# Patient Record
Sex: Male | Born: 1995 | Race: White | Hispanic: No
Health system: Southern US, Community
[De-identification: ages and names within clinical notes are randomized; demographics above are authoritative.]

## PROBLEM LIST (undated history)

## (undated) DIAGNOSIS — C801 Malignant (primary) neoplasm, unspecified: Secondary | ICD-10-CM

## (undated) HISTORY — DX: Malignant (primary) neoplasm, unspecified: C80.1

---
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
## 2015-02-01 ENCOUNTER — Encounter: Payer: Self-pay | Admitting: Family Medicine

## 2015-02-01 DIAGNOSIS — C9101 Acute lymphoblastic leukemia, in remission: Secondary | ICD-10-CM

## 2016-05-06 ENCOUNTER — Encounter: Payer: Self-pay | Admitting: Physician Assistant

## 2016-05-06 DIAGNOSIS — C419 Malignant neoplasm of bone and articular cartilage, unspecified: Secondary | ICD-10-CM | POA: Insufficient documentation

## 2016-06-25 ENCOUNTER — Ambulatory Visit: Payer: BC Managed Care – PPO | Attending: Orthopedic Surgery | Admitting: Physical Therapy

## 2016-06-25 ENCOUNTER — Encounter: Payer: Self-pay | Admitting: Physical Therapy

## 2016-06-25 DIAGNOSIS — M6281 Muscle weakness (generalized): Secondary | ICD-10-CM | POA: Diagnosis present

## 2016-06-25 DIAGNOSIS — M25521 Pain in right elbow: Secondary | ICD-10-CM | POA: Insufficient documentation

## 2016-06-25 DIAGNOSIS — M25611 Stiffness of right shoulder, not elsewhere classified: Secondary | ICD-10-CM | POA: Diagnosis present

## 2016-06-25 DIAGNOSIS — M25621 Stiffness of right elbow, not elsewhere classified: Secondary | ICD-10-CM | POA: Diagnosis present

## 2016-06-25 DIAGNOSIS — M25511 Pain in right shoulder: Secondary | ICD-10-CM

## 2016-06-25 NOTE — Therapy (Signed)
Maurice Wright, [place], [address] Phone: [phone]   Fax:  [phone]  Physical Therapy Evaluation  Patient Details  Name: Maurice Wright MRN: [id number] Date of Birth: December 01, 1995 Referring Provider: Chong Sicilian, MD  Encounter Date: 06/25/2016      PT End of Session - 06/25/16 1257    Visit Number 1   Number of Visits 30   Date for PT Re-Evaluation 09/19/16   Authorization Type BCBS- no visit limit   PT Start Time 1148   PT Stop Time 1231   PT Time Calculation (min) 43 min   Activity Tolerance Patient tolerated treatment well   Behavior During Therapy [facility] for tasks assessed/performed      Past Medical History:  Diagnosis Date  . Cancer [facility])     History reviewed. No pertinent surgical history.  There were no vitals filed for this visit.       Subjective Assessment - 06/25/16 1155    Subjective Pt reports pain is positionally dependent; pain free with arm rested. Feels tense. Yesterday was the first day he was out of his sling (had pillow). Elbow feels more stiff.    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 No/denies   Pain Score --  4/10 last 24 hrs   Pain Location Arm  R shoulder-upper arm-elbow   Pain Orientation Right;Upper   Pain Type Surgical pain   Pain Frequency Intermittent   Aggravating Factors  positionally dependent, stretching elbow   Pain Relieving Factors changing position            [facility] PT Assessment - 06/25/16 0001      Assessment   Medical Diagnosis R humerus replacement   Referring Provider Chong Sicilian, MD   Onset Date/Surgical Date 05/09/16  revision of ulnar aspect on 05/10/16   Hand Dominance Right     Precautions   Precautions Other (comment)   Precaution Comments Cancer-active     Restrictions   Weight Bearing  Restrictions Yes   RUE Weight Bearing Weight bearing as tolerated  weight bearing as appropraite     Balance Screen   Has the patient fallen in the past 6 months No     [place] residence   Living Arrangements Parent     Prior Function   Level of Independence Independent     Cognition   Overall Cognitive Status Within Functional Limits for tasks assessed     Observation/Other Assessments   Focus on Therapeutic Outcomes (FOTO)  28% ability (goal 71%)     Sensation   Additional Comments WFL     ROM / Strength   AROM / PROM / Strength AROM;PROM;Strength     AROM   AROM Assessment Site Shoulder;Elbow   Right/Left Shoulder Right   Right Shoulder Extension 24 Degrees   Right Shoulder Flexion 0 Degrees   Right Shoulder ABduction 0 Degrees   Right Shoulder Internal Rotation 0 Degrees   Right Shoulder External Rotation 0 Degrees   Right/Left Elbow Right   Right Elbow Flexion 0   Right Elbow Extension -37     PROM   PROM Assessment Site Shoulder;Elbow   Right/Left Shoulder Right   Right Shoulder Flexion 55 Degrees   Right Shoulder ABduction 40 Degrees   Right Shoulder Internal Rotation --  0 abduction, to stomach  Right Shoulder External Rotation 0 Degrees  0 deg abduction   Right/Left Elbow Right   Right Elbow Flexion 85   Right Elbow Extension -35     Strength   Overall Strength Comments shoulder and elbow grossly 1/5 at eval   Strength Assessment Site Shoulder;Elbow;Hand   Right/Left Shoulder Right   Right/Left Elbow Right   Right/Left hand Right   Right Hand Grip (lbs) 25 25 25   L 95 90 90     Palpation   Palpation comment TTP at GHJ anterior aspect, limited scar mobility                   [facility] Adult PT Treatment/Exercise - 06/25/16 0001      Exercises   Exercises Shoulder;Elbow     Elbow Exercises   Other elbow exercises self extension stretch with supination stretch     Shoulder Exercises: Supine    External Rotation Limitations elbow rested, lifting hand from stomach     Shoulder Exercises: Seated   Other Seated Exercises scapular retraction for posture     Manual Therapy   Manual Therapy Soft tissue mobilization   Manual therapy comments edu in self mobilization of scar at home   Soft tissue mobilization scar mobilization                PT Education - 06/25/16 1256    Education provided Yes   Education Details anatomy of condition, POC, HEP, exercise form/rationale   Person(s) Educated Patient;Parent(s)   Methods Explanation;Demonstration;Tactile cues;Verbal cues   Comprehension Verbalized understanding;Returned demonstration;Verbal cues required;Tactile cues required;Need further instruction          PT Short Term Goals - 06/25/16 1312      PT SHORT TERM GOAL #1   Title GHJ PROM within 20 deg of L shoulder by 4/20   Baseline L GHJ WFL, see flowsheet   Time 4   Period Weeks   Status New     PT SHORT TERM GOAL #2   Title Pt will demo active elbow flexion to 90 deg against gravity, 2+/5 periscapular MMT by 4/20   Baseline see flowsheet   Time 4   Period Weeks   Status New     PT SHORT TERM GOAL #3   Title Pt able to perform GHJ ROM to shoulder height actively, against gravity by 5/4   Baseline 1/5 gross strength at eval   Time 6   Period Weeks   Status New     PT SHORT TERM GOAL #4   Title Full elbow extension actively by 5/4   Baseline see flowsheet   Time 6   Period Weeks   Status New     PT SHORT TERM GOAL #5   Title Pt will verbalize increased use of RUE for self care activities by 4/20   Baseline not using R arm at eval   Time 4   Period Weeks   Status New           PT Long Term Goals - 06/25/16 1317      PT LONG TERM GOAL #1   Title Pt will be able to use RUE to draw at and below shoulder height without hiking shoulder by 6/15 in order to begin return to school   Baseline unable at eval   Time 12   Period Weeks   Status New      PT LONG TERM GOAL #2   Title Pt will demo GHJ AROM against  gravity within 15 deg of LUE   Baseline see flowsheet   Time 12   Period Weeks   Status New     PT LONG TERM GOAL #3   Title Pt will be able to carry & manipulate a 3lb weight in his R hand to improve functional carrying & lifting capacity   Baseline unable at eval   Time 12   Period Weeks   Status New     PT LONG TERM GOAL #4   Title FOTO to 71% ability to indicate significant improvement in functional ability   Baseline 28% ability at eval   Time 12   Period Weeks   Status New               Plan - 06/25/16 1258    Clinical Impression Statement Pt presents to PT with complaints of R arm pain, tightness and limited functional use. Pt is significantly lacking ROM in shoulder and elbow, limited by joint and incision scar that extends from shoulder to below the elbow. MMT in shoulder and elbow grossly 1/5 with poor grip strength. 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 Pt will benefit from skilled PT in order to improve functional use of arm to return to age appropriate activities and meet long term functional goals. Pt will be in and out for periods of time for chemo therapy, Dad reports he will be receiving inpatient rehab during 5 day stays at the hospital. I asked pt to sign a release form for open communication between outpatient and inpatient rehab.    Rehab Potential Good   PT Frequency 3x / week  except for weeks he is getting inpatient rehab in the hospital during chemo   PT Duration 12 weeks   PT Treatment/Interventions ADLs/Self Care Home Management;Cryotherapy;Functional mobility training;Therapeutic activities;Therapeutic exercise;Neuromuscular re-education;Patient/family education;Passive range of motion;Scar mobilization;Manual lymph drainage;Manual techniques;Taping   PT Next Visit Plan scar mobility, RC activation, elbow extension stretch,  periscapular activation   PT Home Exercise Plan scar mobility, playdough hand work, scapular retraction, passive supination stretch-pull hand fwd to stretch elbow-active GHJ ext to pull back;    Consulted and Agree with Plan of Care Patient;Family member/caregiver   Family Member Consulted Dad      Patient will benefit from skilled therapeutic intervention in order to improve the following deficits and impairments:  Decreased range of motion, Increased fascial restricitons, Impaired UE functional use, Increased muscle spasms, Decreased endurance, Decreased activity tolerance, Pain, Improper body mechanics, Impaired flexibility, Hypomobility, Decreased scar mobility, Decreased mobility, Decreased strength, Postural dysfunction  Visit Diagnosis: Right shoulder pain, unspecified chronicity - Plan: PT plan of care cert/re-cert  Stiffness of right shoulder, not elsewhere classified - Plan: PT plan of care cert/re-cert  Pain in right elbow - Plan: PT plan of care cert/re-cert  Stiffness of right elbow, not elsewhere classified - Plan: PT plan of care cert/re-cert  Muscle weakness (generalized) - Plan: PT plan of care cert/re-cert     Problem List Patient Active Problem List   Diagnosis Date Noted  . 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 Leukemia, lymphocytic, acute ([place]) 12/21/2012    Maurice Wright PT, DPT 06/25/16 1:30 PM   [facility] [address] [place], [place], [address] Phone: [phone]   Fax:  [phone]  Name: Maurice Wright MRN: [id number] Date of Birth: 06/16/1995

## 2016-06-27 ENCOUNTER — Encounter: Payer: Self-pay | Admitting: Physical Therapy

## 2016-06-27 ENCOUNTER — Ambulatory Visit: Payer: BC Managed Care – PPO | Admitting: Physical Therapy

## 2016-06-27 DIAGNOSIS — M25511 Pain in right shoulder: Secondary | ICD-10-CM | POA: Diagnosis not present

## 2016-06-27 DIAGNOSIS — M25621 Stiffness of right elbow, not elsewhere classified: Secondary | ICD-10-CM

## 2016-06-27 DIAGNOSIS — M6281 Muscle weakness (generalized): Secondary | ICD-10-CM

## 2016-06-27 DIAGNOSIS — M25611 Stiffness of right shoulder, not elsewhere classified: Secondary | ICD-10-CM

## 2016-06-27 DIAGNOSIS — M25521 Pain in right elbow: Secondary | ICD-10-CM

## 2016-06-27 NOTE — Therapy (Signed)
[place], [place], [address] Phone: [phone]   Fax:  [phone]  Physical Therapy Treatment  Patient Details  Name: Maurice Wright MRN: [id number] Date of Birth: October 11, 1995 Referring Provider: Chong Sicilian, MD  Encounter Date: 06/27/2016      PT End of Session - 06/27/16 1221    Visit Number 2   Number of Visits 30   Date for PT Re-Evaluation 09/19/16   Authorization Type BCBS- no visit limit   PT Start Time 0806   PT Stop Time 0846   PT Time Calculation (min) 40 min   Activity Tolerance Patient tolerated treatment well   Behavior During Therapy [facility] for tasks assessed/performed      Past Medical History:  Diagnosis Date  . Cancer ([place])     No past surgical history on file.  There were no vitals filed for this visit.      Subjective Assessment - 06/27/16 0807    Subjective "things are going fine, some pain in the shoulder"    Currently in Pain? Yes   Pain Score 2    Pain Location Arm   Pain Orientation Right;Upper   Pain Descriptors / Indicators Aching;Sore   Pain Type Surgical pain                         [facility] Adult PT Treatment/Exercise - 06/27/16 0001      Elbow Exercises   Other elbow exercises 2 x 10 bicep curl with L hand assist PRN,   increased focus on eccentric lowering     Shoulder Exercises: Supine   Other Supine Exercises scapular retraction 2 x 10 with 3 sec hold     Shoulder Exercises: Isometric Strengthening   Flexion --  1 x 10 with 5 sec hold   Extension --  1 x 10 with 5 sec hold   External Rotation --  1 x 10 with 5 sec hold   Internal Rotation --  1 x 10 with 5 sec hold     Manual Therapy   Manual Therapy Passive ROM;Joint mobilization   Soft tissue mobilization scar mobilization   Passive ROM elbow extension with gentle distraction ossciliations, GHJ with flexion/ abduction                   PT Short Term Goals  - 06/25/16 1312      PT SHORT TERM GOAL #1   Title GHJ PROM within 20 deg of L shoulder by 4/20   Baseline L GHJ WFL, see flowsheet   Time 4   Period Weeks   Status New     PT SHORT TERM GOAL #2   Title Pt will demo active elbow flexion to 90 deg against gravity, 2+/5 periscapular MMT by 4/20   Baseline see flowsheet   Time 4   Period Weeks   Status New     PT SHORT TERM GOAL #3   Title Pt able to perform GHJ ROM to shoulder height actively, against gravity by 5/4   Baseline 1/5 gross strength at eval   Time 6   Period Weeks   Status New     PT SHORT TERM GOAL #4   Title Full elbow extension actively by 5/4   Baseline see flowsheet   Time 6   Period Weeks   Status New     PT SHORT TERM GOAL #5   Title Pt will verbalize increased  use of RUE for self care activities by 4/20   Baseline not using R arm at eval   Time 4   Period Weeks   Status New           PT Long Term Goals - 06/25/16 1317      PT LONG TERM GOAL #1   Title Pt will be able to use RUE to draw at and below shoulder height without hiking shoulder by 6/15 in order to begin return to school   Baseline unable at eval   Time 12   Period Weeks   Status New     PT LONG TERM GOAL #2   Title Pt will demo GHJ AROM against gravity within 15 deg of LUE   Baseline see flowsheet   Time 12   Period Weeks   Status New     PT LONG TERM GOAL #3   Title Pt will be able to carry & manipulate a 3lb weight in his R hand to improve functional carrying & lifting capacity   Baseline unable at eval   Time 12   Period Weeks   Status New     PT LONG TERM GOAL #4   Title FOTO to 71% ability to indicate significant improvement in functional ability   Baseline 28% ability at eval   Time 12   Period Weeks   Status New               Plan - 06/27/16 1221    Clinical Impression Statement pt continues to demo singificant tightness in the elbow limiting flexion/ extension. Reports consistency with HEP. Focused  on manual increasing elbow mobility, and PROM for shoulder with flexion/ abuction. worked on eccentric bicep strengthe,ing and isometric strengthenig for the shoulder to promote muscle activation.  He reported no changes in pain, and declined modalities.    PT Next Visit Plan scar mobility, RC activation, elbow extension stretch, periscapular activation   PT Home Exercise Plan scar mobility, playdough hand work, scapular retraction, passive supination stretch-pull hand fwd to stretch elbow-active GHJ ext to pull back;    Consulted and Agree with Plan of Care Patient;Family member/caregiver      Patient will benefit from skilled therapeutic intervention in order to improve the following deficits and impairments:  Decreased range of motion, Increased fascial restricitons, Impaired UE functional use, Increased muscle spasms, Decreased endurance, Decreased activity tolerance, Pain, Improper body mechanics, Impaired flexibility, Hypomobility, Decreased scar mobility, Decreased mobility, Decreased strength, Postural dysfunction  Visit Diagnosis: Right shoulder pain, unspecified chronicity  Stiffness of right shoulder, not elsewhere classified  Pain in right elbow  Stiffness of right elbow, not elsewhere classified  Muscle weakness (generalized)     Problem List Patient Active Problem List   Diagnosis Date Noted  . 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 Leukemia, lymphocytic, acute ([place]) 12/21/2012   Starr Lake PT, DPT, LAT, ATC  06/27/16  12:28 PM      [facility] [address] [place], [place], [address] Phone: [phone]   Fax:  [phone]  Name: Jhordan Mckibben MRN: [id number] Date of Birth: 1996-01-28

## 2016-07-07 ENCOUNTER — Ambulatory Visit: Payer: BC Managed Care – PPO | Attending: Orthopedic Surgery | Admitting: Physical Therapy

## 2016-07-07 ENCOUNTER — Encounter: Payer: Self-pay | Admitting: Physical Therapy

## 2016-07-07 DIAGNOSIS — M25621 Stiffness of right elbow, not elsewhere classified: Secondary | ICD-10-CM | POA: Diagnosis present

## 2016-07-07 DIAGNOSIS — M25611 Stiffness of right shoulder, not elsewhere classified: Secondary | ICD-10-CM | POA: Insufficient documentation

## 2016-07-07 DIAGNOSIS — M6281 Muscle weakness (generalized): Secondary | ICD-10-CM

## 2016-07-07 DIAGNOSIS — M25511 Pain in right shoulder: Secondary | ICD-10-CM | POA: Insufficient documentation

## 2016-07-07 DIAGNOSIS — M25521 Pain in right elbow: Secondary | ICD-10-CM | POA: Diagnosis present

## 2016-07-07 NOTE — Therapy (Signed)
[place], [place], [address] Phone: [phone]   Fax:  [phone]  Physical Therapy Treatment  Patient Details  Name: Maurice Wright MRN: [id number] Date of Birth: [birth year] Referring Provider: Chong Sicilian, MD  Encounter Date: 07/07/2016      PT End of Session - 07/07/16 1330    Visit Number 3   Number of Visits 30   Date for PT Re-Evaluation 09/19/16   Authorization Type BCBS- no visit limit   PT Start Time 1331   PT Stop Time 1413   PT Time Calculation (min) 42 min   Activity Tolerance Patient tolerated treatment well   Behavior During Therapy [facility] for tasks assessed/performed      Past Medical History:  Diagnosis Date  . Cancer [facility])     History reviewed. No pertinent surgical history.  There were no vitals filed for this visit.      Subjective Assessment - 07/07/16 1331    Subjective Reports PT in hospital doing isometrics and soft tissue work. Denies pain today. Does not feel the "dropping" feeling in his shoulder.    Currently in Pain? No/denies                         [facility] Adult PT Treatment/Exercise - 07/07/16 0001      Elbow Exercises   Elbow Flexion Limitations supine elbow flexion & ext with wand, supinated & pronated     Shoulder Exercises: Seated   Other Seated Exercises hand crawl to scapular retraction   Other Seated Exercises pronation/supination velcro board      Shoulder Exercises: Standing   Other Standing Exercises marble pick up, supinate to drop into vase     Shoulder Exercises: Isometric Strengthening   Flexion Limitations supine resisted by PT, assisted hold for elbow flexion   Extension Limitations supine into towel   External Rotation Limitations supine resisted by PT   Internal Rotation --  supine resisted by PT     Manual Therapy   Soft tissue mobilization scar mobilization   Passive ROM elbow extension                 PT Education - 07/07/16 1422    Education provided Yes   Education Details manual & exercise form/rationale   Person(s) Educated Patient   Methods Explanation;Demonstration;Tactile cues;Verbal cues   Comprehension Verbalized understanding;Returned demonstration;Verbal cues required;Tactile cues required;Need further instruction          PT Short Term Goals - 06/25/16 1312      PT SHORT TERM GOAL #1   Title GHJ PROM within 20 deg of L shoulder by 4/20   Baseline L GHJ WFL, see flowsheet   Time 4   Period Weeks   Status New     PT SHORT TERM GOAL #2   Title Pt will demo active elbow flexion to 90 deg against gravity, 2+/5 periscapular MMT by 4/20   Baseline see flowsheet   Time 4   Period Weeks   Status New     PT SHORT TERM GOAL #3   Title Pt able to perform GHJ ROM to shoulder height actively, against gravity by 5/4   Baseline 1/5 gross strength at eval   Time 6   Period Weeks   Status New     PT SHORT TERM GOAL #4   Title Full elbow extension actively by 5/4   Baseline see flowsheet   Time 6  Period Weeks   Status New     PT SHORT TERM GOAL #5   Title Pt will verbalize increased use of RUE for self care activities by 4/20   Baseline not using R arm at eval   Time 4   Period Weeks   Status New           PT Long Term Goals - 06/25/16 1317      PT LONG TERM GOAL #1   Title Pt will be able to use RUE to draw at and below shoulder height without hiking shoulder by 6/15 in order to begin return to school   Baseline unable at eval   Time 12   Period Weeks   Status New     PT LONG TERM GOAL #2   Title Pt will demo GHJ AROM against gravity within 15 deg of LUE   Baseline see flowsheet   Time 12   Period Weeks   Status New     PT LONG TERM GOAL #3   Title Pt will be able to carry & manipulate a 3lb weight in his R hand to improve functional carrying & lifting capacity   Baseline unable at eval   Time 12   Period Weeks   Status New     PT LONG  TERM GOAL #4   Title FOTO to 71% ability to indicate significant improvement in functional ability   Baseline 28% ability at eval   Time 12   Period Weeks   Status New               Plan - 07/07/16 1414    Clinical Impression Statement Returned to PT from inpatient rehab/chemo today. Increased challenges to wrist supinators and prontators today in conjunction with elbow flexors. Minimal activation noted around GHJ continues to limit ablity to perform higher level exercises.    PT Next Visit Plan scar mobility, RC activation, elbow extension stretch, periscapular activation   PT Home Exercise Plan scar mobility, playdough hand work, scapular retraction, passive supination stretch-pull hand fwd to stretch elbow-active GHJ ext to pull back; table slide (move GHJ)   Consulted and Agree with Plan of Care Patient      Patient will benefit from skilled therapeutic intervention in order to improve the following deficits and impairments:     Visit Diagnosis: Right shoulder pain, unspecified chronicity  Stiffness of right shoulder, not elsewhere classified  Pain in right elbow  Stiffness of right elbow, not elsewhere classified  Muscle weakness (generalized)     Problem List Patient Active Problem List   Diagnosis Date Noted  . 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 Leukemia, lymphocytic, acute ([place]) 12/21/2012   Alessandra Sawdey C. Cliffie Gingras PT, DPT 07/07/16 2:25 PM   [facility] [address] [place], [place], [address] Phone: [phone]   Fax:  [phone]  Name: Maurice Wright MRN: [id number] Date of Birth: 1996-04-04

## 2016-07-09 ENCOUNTER — Ambulatory Visit: Payer: BC Managed Care – PPO | Admitting: Physical Therapy

## 2016-07-09 DIAGNOSIS — M25521 Pain in right elbow: Secondary | ICD-10-CM

## 2016-07-09 DIAGNOSIS — M25611 Stiffness of right shoulder, not elsewhere classified: Secondary | ICD-10-CM

## 2016-07-09 DIAGNOSIS — M25511 Pain in right shoulder: Secondary | ICD-10-CM | POA: Diagnosis not present

## 2016-07-09 DIAGNOSIS — M25621 Stiffness of right elbow, not elsewhere classified: Secondary | ICD-10-CM

## 2016-07-09 DIAGNOSIS — M6281 Muscle weakness (generalized): Secondary | ICD-10-CM

## 2016-07-09 NOTE — Therapy (Signed)
[name], [place], [address] Phone: [phone]   Fax:  [phone]  Physical Therapy Treatment  Patient Details  Name: Maurice Wright MRN: [id number] Date of Birth: [birth year] Referring Provider: Chong Sicilian, MD  Encounter Date: 07/09/2016      PT End of Session - 07/09/16 1420    Visit Number 4   Number of Visits 30   Date for PT Re-Evaluation 09/19/16   Authorization Type BCBS- no visit limit   PT Start Time 0100   PT Stop Time 0150   PT Time Calculation (min) 50 min      Past Medical History:  Diagnosis Date  . Cancer ([place])     No past surgical history on file.  There were no vitals filed for this visit.      Subjective Assessment - 07/09/16 1302    Subjective Sore after last visit at the outside elbow   Currently in Pain? No/denies   Aggravating Factors  nothing   Pain Relieving Factors nothing                          [facility] Adult PT Treatment/Exercise - 07/09/16 0001      Elbow Exercises   Elbow Flexion Limitations supine elbow flexion and extension with longs hold in extension concurrent with scar tissue massage      Shoulder Exercises: Supine   External Rotation Limitations elbow rested, lifting hand from stomach   Internal Rotation Limitations rhythmic stab at neutral     Shoulder Exercises: Seated   Other Seated Exercises hand crawl to scapular retraction   Other Seated Exercises pronation/supination velcro board , wrist flexion and extension , yellow puddy x 1 minute grip      Shoulder Exercises: Isometric Strengthening   Flexion Limitations supine resisted by PTA, assisted hold for elbow flexion   Extension Limitations supine into towel   External Rotation Limitations supine resisted by PTA   Internal Rotation --  supine resisted by PTA     Manual Therapy   Soft tissue mobilization scar mobilization   Passive ROM elbow extension, shoulder  flexion                  PT Short Term Goals - 06/25/16 1312      PT SHORT TERM GOAL #1   Title GHJ PROM within 20 deg of L shoulder by 4/20   Baseline L GHJ WFL, see flowsheet   Time 4   Period Weeks   Status New     PT SHORT TERM GOAL #2   Title Pt will demo active elbow flexion to 90 deg against gravity, 2+/5 periscapular MMT by 4/20   Baseline see flowsheet   Time 4   Period Weeks   Status New     PT SHORT TERM GOAL #3   Title Pt able to perform GHJ ROM to shoulder height actively, against gravity by 5/4   Baseline 1/5 gross strength at eval   Time 6   Period Weeks   Status New     PT SHORT TERM GOAL #4   Title Full elbow extension actively by 5/4   Baseline see flowsheet   Time 6   Period Weeks   Status New     PT SHORT TERM GOAL #5   Title Pt will verbalize increased use of RUE for self care activities by 4/20   Baseline not using R arm at  eval   Time 4   Period Weeks   Status New           PT Long Term Goals - 06/25/16 1317      PT LONG TERM GOAL #1   Title Pt will be able to use RUE to draw at and below shoulder height without hiking shoulder by 6/15 in order to begin return to school   Baseline unable at eval   Time 12   Period Weeks   Status New     PT LONG TERM GOAL #2   Title Pt will demo GHJ AROM against gravity within 15 deg of LUE   Baseline see flowsheet   Time 12   Period Weeks   Status New     PT LONG TERM GOAL #3   Title Pt will be able to carry & manipulate a 3lb weight in his R hand to improve functional carrying & lifting capacity   Baseline unable at eval   Time 12   Period Weeks   Status New     PT LONG TERM GOAL #4   Title FOTO to 71% ability to indicate significant improvement in functional ability   Baseline 28% ability at eval   Time 12   Period Weeks   Status New               Plan - 07/09/16 1422    Clinical Impression Statement Pt reports no pain today. He is consistent with HEP. Continued  scar mobilization and passive elbow stretching to increase elbow extension. Also continued isometric shoulder and elbow exercises. Pt reports no increased pain after treatment. Pt given yellow puddy to work on grip strength.    PT Next Visit Plan scar mobility, RC activation, elbow extension stretch, periscapular activation   PT Home Exercise Plan scar mobility, playdough hand work, scapular retraction, passive supination stretch-pull hand fwd to stretch elbow-active GHJ ext to pull back; table slide (move GHJ)   Consulted and Agree with Plan of Care Patient      Patient will benefit from skilled therapeutic intervention in order to improve the following deficits and impairments:  Decreased range of motion, Increased fascial restricitons, Impaired UE functional use, Increased muscle spasms, Decreased endurance, Decreased activity tolerance, Pain, Improper body mechanics, Impaired flexibility, Hypomobility, Decreased scar mobility, Decreased mobility, Decreased strength, Postural dysfunction  Visit Diagnosis: Right shoulder pain, unspecified chronicity  Stiffness of right shoulder, not elsewhere classified  Pain in right elbow  Stiffness of right elbow, not elsewhere classified  Muscle weakness (generalized)     Problem List Patient Active Problem List   Diagnosis Date Noted  . 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 Leukemia, lymphocytic, acute ([place]) 12/21/2012    Dorene Ar, PTA 07/09/2016, 2:25 PM  [facility] [address] [place], [place], [address] Phone: [phone]   Fax:  [phone]  Name: Maurice Wright MRN: [id number] Date of Birth: 1995/09/24

## 2016-07-11 ENCOUNTER — Ambulatory Visit: Payer: BC Managed Care – PPO | Admitting: Physical Therapy

## 2016-07-11 DIAGNOSIS — M25521 Pain in right elbow: Secondary | ICD-10-CM

## 2016-07-11 DIAGNOSIS — M25511 Pain in right shoulder: Secondary | ICD-10-CM

## 2016-07-11 DIAGNOSIS — M25611 Stiffness of right shoulder, not elsewhere classified: Secondary | ICD-10-CM

## 2016-07-11 DIAGNOSIS — M25621 Stiffness of right elbow, not elsewhere classified: Secondary | ICD-10-CM

## 2016-07-11 DIAGNOSIS — M6281 Muscle weakness (generalized): Secondary | ICD-10-CM

## 2016-07-11 NOTE — Therapy (Addendum)
[place], [place], [address] Phone: [phone]   Fax:  [phone]  Physical Therapy Treatment  Patient Details  Name: Maurice Wright MRN: [id number] Date of Birth: 1995-11-23 Referring Provider: Chong Sicilian, MD  Encounter Date: 07/11/2016      PT End of Session - 07/11/16 0900    Visit Number 5   Number of Visits 30   Date for PT Re-Evaluation 09/19/16   Authorization Type BCBS- no visit limit   PT Start Time 0850   PT Stop Time 0930   PT Time Calculation (min) 40 min   Activity Tolerance Patient tolerated treatment well      Past Medical History:  Diagnosis Date  . Cancer ([place])     No past surgical history on file.  There were no vitals filed for this visit.      Subjective Assessment - 07/11/16 0900    Subjective I was sore yesterday. Hand was sore from the puddy   Currently in Pain? No/denies                         [facility] Adult PT Treatment/Exercise - 07/11/16 0001      Shoulder Exercises: Supine   Internal Rotation Limitations rhythmic stab at neutral   Other Supine Exercises scapular retraction 2 x 10 with 3 sec hold     Shoulder Exercises: Seated   Other Seated Exercises pronation/supination velcro board , wrist flexion and extension ,     Shoulder Exercises: Standing   Other Standing Exercises marble pick up, supinate to drop into vase     Shoulder Exercises: Isometric Strengthening   Flexion Limitations supine resisted by PT, assisted hold for elbow flexion   Extension Limitations supine into towel   External Rotation Limitations supine resisted by PT   Internal Rotation --  supine resisted by PT     Manual Therapy   Soft tissue mobilization scar mobilization   Passive ROM elbow extension, shoulder flexion                  PT Short Term Goals - 06/25/16 1312      PT SHORT TERM GOAL #1   Title GHJ PROM within 20 deg of L shoulder  by 4/20   Baseline L GHJ WFL, see flowsheet   Time 4   Period Weeks   Status New     PT SHORT TERM GOAL #2   Title Pt will demo active elbow flexion to 90 deg against gravity, 2+/5 periscapular MMT by 4/20   Baseline see flowsheet   Time 4   Period Weeks   Status New     PT SHORT TERM GOAL #3   Title Pt able to perform GHJ ROM to shoulder height actively, against gravity by 5/4   Baseline 1/5 gross strength at eval   Time 6   Period Weeks   Status New     PT SHORT TERM GOAL #4   Title Full elbow extension actively by 5/4   Baseline see flowsheet   Time 6   Period Weeks   Status New     PT SHORT TERM GOAL #5   Title Pt will verbalize increased use of RUE for self care activities by 4/20   Baseline not using R arm at eval   Time 4   Period Weeks   Status New           PT  Long Term Goals - 06/25/16 1317      PT LONG TERM GOAL #1   Title Pt will be able to use RUE to draw at and below shoulder height without hiking shoulder by 6/15 in order to begin return to school   Baseline unable at eval   Time 12   Period Weeks   Status New     PT LONG TERM GOAL #2   Title Pt will demo GHJ AROM against gravity within 15 deg of LUE   Baseline see flowsheet   Time 12   Period Weeks   Status New     PT LONG TERM GOAL #3   Title Pt will be able to carry & manipulate a 3lb weight in his R hand to improve functional carrying & lifting capacity   Baseline unable at eval   Time 12   Period Weeks   Status New     PT LONG TERM GOAL #4   Title FOTO to 71% ability to indicate significant improvement in functional ability   Baseline 28% ability at eval   Time 12   Period Weeks   Status New               Plan - 07/11/16 8563    Clinical Impression Statement Pt reports no pain today. He is consistent with HEP. Stated was experiencing some soreness from using yellow puddy. Continued scar mobilization and passive elbow stretching to increase elbow extension. Continued  passive flexion of shoulder and isometric shoulder and elbow exercises in supine. Worked with velcro board to increase forearm supination and pronation strength. Picked up marbles to increase grip accuracy and strength.    PT Treatment/Interventions ADLs/Self Care Home Management;Cryotherapy;Functional mobility training;Therapeutic activities;Therapeutic exercise;Neuromuscular re-education;Patient/family education;Passive range of motion;Scar mobilization;Manual lymph drainage;Manual techniques;Taping   PT Next Visit Plan scar mobility, RC activation, elbow extension stretch, periscapular activation   PT Home Exercise Plan scar mobility, playdough hand work, scapular retraction, passive supination stretch-pull hand fwd to stretch elbow-active GHJ ext to pull back; table slide (move GHJ)     During this treatment session, the therapist was present, participating in and directing the treatment.  Patient will benefit from skilled therapeutic intervention in order to improve the following deficits and impairments:  Decreased range of motion, Increased fascial restricitons, Impaired UE functional use, Increased muscle spasms, Decreased endurance, Decreased activity tolerance, Pain, Improper body mechanics, Impaired flexibility, Hypomobility, Decreased scar mobility, Decreased mobility, Decreased strength, Postural dysfunction  Visit Diagnosis: Right shoulder pain, unspecified chronicity  Stiffness of right shoulder, not elsewhere classified  Pain in right elbow  Stiffness of right elbow, not elsewhere classified  Muscle weakness (generalized)     Problem List Patient Active Problem List   Diagnosis Date Noted  . Maurice sarcoma ([place]) 05/06/2016  . Leukemia, lymphocytic, acute ([place]) 12/21/2012    Janna Arch, SPTA 07/11/2016, 10:11 AM  Hessie Diener, PTA 07/11/2016, 10:13 AM  [facility] [address] [place], [place], [address] Phone:  [phone]   Fax:  [phone]  Name: Maurice Wright MRN: [id number] Date of Birth: [birth year]

## 2016-07-21 ENCOUNTER — Ambulatory Visit: Payer: BC Managed Care – PPO | Admitting: Physical Therapy

## 2016-07-21 ENCOUNTER — Encounter: Payer: Self-pay | Admitting: Physical Therapy

## 2016-07-21 DIAGNOSIS — M25511 Pain in right shoulder: Secondary | ICD-10-CM | POA: Diagnosis not present

## 2016-07-21 DIAGNOSIS — M25521 Pain in right elbow: Secondary | ICD-10-CM

## 2016-07-21 DIAGNOSIS — M6281 Muscle weakness (generalized): Secondary | ICD-10-CM

## 2016-07-21 DIAGNOSIS — M25621 Stiffness of right elbow, not elsewhere classified: Secondary | ICD-10-CM

## 2016-07-21 DIAGNOSIS — M25611 Stiffness of right shoulder, not elsewhere classified: Secondary | ICD-10-CM

## 2016-07-21 NOTE — Therapy (Signed)
[place], [place], [address] Phone: [phone]   Fax:  [phone]  Physical Therapy Treatment  Patient Details  Name: Maurice Wright MRN: [id number] Date of Birth: 1995-06-24 Referring Provider: Chong Sicilian, MD  Encounter Date: 07/21/2016      PT End of Session - 07/21/16 1722    Visit Number 6   Number of Visits 30   Date for PT Re-Evaluation 09/19/16   PT Start Time 4235   PT Stop Time 1725   PT Time Calculation (min) 49 min   Activity Tolerance Patient tolerated treatment well   Behavior During Therapy [facility] for tasks assessed/performed      Past Medical History:  Diagnosis Date  . Cancer ([place])     No past surgical history on file.  There were no vitals filed for this visit.      Subjective Assessment - 07/21/16 1638    Subjective "doing pretty good, did alot massage with PT in the hospital which helped get the elbow"    Currently in Pain? No/denies   Pain Score 0-No pain   Pain Location Arm   Pain Orientation Right;Upper   Aggravating Factors  N/A   Pain Relieving Factors N/A            [facility] PT Assessment - 07/21/16 1736      AROM   Right Elbow Extension -28                     [facility] Adult PT Treatment/Exercise - 07/21/16 1641      Shoulder Exercises: Pulleys   Flexion 3 minutes   Flexion Limitations chair scoot back to prevent elbow pain      Modalities   Modalities Cryotherapy     Cryotherapy   Number Minutes Cryotherapy 10 Minutes   Cryotherapy Location Upper arm   Type of Cryotherapy Ice pack  in ssupine with thumb up, with slow creep stretch     Manual Therapy   Manual Therapy Joint mobilization   Manual therapy comments manual trigger point release x 4 over bicep brachii (distal to proximal), x 2 in distal tricep   Joint Mobilization grade 2-3 PA / AP mobs with gentle distraction    Soft tissue mobilization scar mobilization, DTM over  distal biceps/ triceps   Passive ROM elbow extension, shoulder flexion                  PT Short Term Goals - 06/25/16 1312      PT SHORT TERM GOAL #1   Title GHJ PROM within 20 deg of L shoulder by 4/20   Baseline L GHJ WFL, see flowsheet   Time 4   Period Weeks   Status New     PT SHORT TERM GOAL #2   Title Pt will demo active elbow flexion to 90 deg against gravity, 2+/5 periscapular MMT by 4/20   Baseline see flowsheet   Time 4   Period Weeks   Status New     PT SHORT TERM GOAL #3   Title Pt able to perform GHJ ROM to shoulder height actively, against gravity by 5/4   Baseline 1/5 gross strength at eval   Time 6   Period Weeks   Status New     PT SHORT TERM GOAL #4   Title Full elbow extension actively by 5/4   Baseline see flowsheet   Time 6   Period Weeks   Status  New     PT SHORT TERM GOAL #5   Title Pt will verbalize increased use of RUE for self care activities by 4/20   Baseline not using R arm at eval   Time 4   Period Weeks   Status New           PT Long Term Goals - 06/25/16 1317      PT LONG TERM GOAL #1   Title Pt will be able to use RUE to draw at and below shoulder height without hiking shoulder by 6/15 in order to begin return to school   Baseline unable at eval   Time 12   Period Weeks   Status New     PT LONG TERM GOAL #2   Title Pt will demo GHJ AROM against gravity within 15 deg of LUE   Baseline see flowsheet   Time 12   Period Weeks   Status New     PT LONG TERM GOAL #3   Title Pt will be able to carry & manipulate a 3lb weight in his R hand to improve functional carrying & lifting capacity   Baseline unable at eval   Time 12   Period Weeks   Status New     PT LONG TERM GOAL #4   Title FOTO to 71% ability to indicate significant improvement in functional ability   Baseline 28% ability at eval   Time 12   Period Weeks   Status New               Plan - 07/21/16 1733    Clinical Impression Statement  pt continues to report no pain. Focused on manual to improve elbow mobility today via mobs, manual trigger point release techniques, and IASTM.  he reported soreness post session at 2/ 10 and opted for ice which pt was positioned to allow for slow creep stretch.    PT Next Visit Plan scar mobility, RC activation, elbow extension stretch, periscapular activation   Consulted and Agree with Plan of Care Patient      Patient will benefit from skilled therapeutic intervention in order to improve the following deficits and impairments:     Visit Diagnosis: Right shoulder pain, unspecified chronicity  Stiffness of right shoulder, not elsewhere classified  Pain in right elbow  Stiffness of right elbow, not elsewhere classified  Muscle weakness (generalized)     Problem List Patient Active Problem List   Diagnosis Date Noted  . 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 Leukemia, lymphocytic, acute ([place]) 12/21/2012    Starr Lake PT, DPT, LAT, ATC  07/21/16  5:37 PM      [facility] [address] [place], [place], [address] Phone: [phone]   Fax:  [phone]  Name: Maurice Wright MRN: [id number] Date of Birth: 1995-09-01

## 2016-07-23 ENCOUNTER — Encounter: Payer: Self-pay | Admitting: Physical Therapy

## 2016-07-23 ENCOUNTER — Ambulatory Visit: Payer: BC Managed Care – PPO | Admitting: Physical Therapy

## 2016-07-23 DIAGNOSIS — M25511 Pain in right shoulder: Secondary | ICD-10-CM

## 2016-07-23 DIAGNOSIS — M25621 Stiffness of right elbow, not elsewhere classified: Secondary | ICD-10-CM

## 2016-07-23 DIAGNOSIS — M25521 Pain in right elbow: Secondary | ICD-10-CM

## 2016-07-23 DIAGNOSIS — M25611 Stiffness of right shoulder, not elsewhere classified: Secondary | ICD-10-CM

## 2016-07-23 DIAGNOSIS — M6281 Muscle weakness (generalized): Secondary | ICD-10-CM

## 2016-07-23 NOTE — Therapy (Signed)
[place], [place], [address] Phone: [phone]   Fax:  [phone]  Physical Therapy Treatment  Patient Details  Name: Maurice Wright MRN: [id number] Date of Birth: [birth year] Referring Provider: Chong Sicilian, MD  Encounter Date: 07/23/2016      PT End of Session - 07/23/16 1634    Visit Number 7   Number of Visits 30   Date for PT Re-Evaluation 09/19/16   Authorization Type BCBS- no visit limit   PT Start Time 1633   PT Stop Time 3716   PT Time Calculation (min) 42 min   Activity Tolerance Patient tolerated treatment well   Behavior During Therapy [facility] for tasks assessed/performed      Past Medical History:  Diagnosis Date  . Cancer [facility])     History reviewed. No pertinent surgical history.  There were no vitals filed for this visit.      Subjective Assessment - 07/23/16 1728    Subjective felt pretty good after last visit, has been doing creeper stretch and some isometric exercises. is using hand to draw a little more often but has difficulty with circular figures. Denies shoulder pain. When scar does hurt, pain is at proximal region.    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 No/denies            [facility] PT Assessment - 07/23/16 0001      AROM   Right Elbow Extension -26  more muscle stretch than hard end feel                     [facility] Adult PT Treatment/Exercise - 07/23/16 0001      Shoulder Exercises: Supine   External Rotation Other (comment)  AROM small mid range   Flexion Limitations supine AAROM punch   Other Supine Exercises AROM elbow flx/ext small mid range   Other Supine Exercises supine triceps press     Shoulder Exercises: Isometric Strengthening   Flexion Limitations iso hold with elbow at 90 flx     Manual Therapy   Joint Mobilization Gr3 ulnar extension mobs at end range elbow extension   Soft tissue mobilization  scar mobilization anterior aspect of arm and at ulnar aspect   Passive ROM elbow extension stretch with long duration hold; GHJ flexion                PT Education - 07/23/16 1729    Education provided Yes   Education Details manual & exercise form/rationale, HEP   Person(s) Educated Patient   Methods Explanation;Demonstration;Tactile cues;Verbal cues   Comprehension Verbalized understanding;Returned demonstration;Verbal cues required;Tactile cues required;Need further instruction          PT Short Term Goals - 06/25/16 1312      PT SHORT TERM GOAL #1   Title GHJ PROM within 20 deg of L shoulder by 4/20   Baseline L GHJ WFL, see flowsheet   Time 4   Period Weeks   Status New     PT SHORT TERM GOAL #2   Title Pt will demo active elbow flexion to 90 deg against gravity, 2+/5 periscapular MMT by 4/20   Baseline see flowsheet   Time 4   Period Weeks   Status New     PT SHORT TERM GOAL #3   Title Pt able to perform GHJ ROM to shoulder height actively, against gravity by 5/4   Baseline 1/5 gross strength at  eval   Time 6   Period Weeks   Status New     PT SHORT TERM GOAL #4   Title Full elbow extension actively by 5/4   Baseline see flowsheet   Time 6   Period Weeks   Status New     PT SHORT TERM GOAL #5   Title Pt will verbalize increased use of RUE for self care activities by 4/20   Baseline not using R arm at eval   Time 4   Period Weeks   Status New           PT Long Term Goals - 06/25/16 1317      PT LONG TERM GOAL #1   Title Pt will be able to use RUE to draw at and below shoulder height without hiking shoulder by 6/15 in order to begin return to school   Baseline unable at eval   Time 12   Period Weeks   Status New     PT LONG TERM GOAL #2   Title Pt will demo GHJ AROM against gravity within 15 deg of LUE   Baseline see flowsheet   Time 12   Period Weeks   Status New     PT LONG TERM GOAL #3   Title Pt will be able to carry &  manipulate a 3lb weight in his R hand to improve functional carrying & lifting capacity   Baseline unable at eval   Time 12   Period Weeks   Status New     PT LONG TERM GOAL #4   Title FOTO to 71% ability to indicate significant improvement in functional ability   Baseline 28% ability at eval   Time 12   Period Weeks   Status New               Plan - 07/23/16 1725    Clinical Impression Statement Did not gain notable ROM today but pt did report more of a sensation of biceps stretch rather than hard end feel. Hard end feel at ulnar aspect in elbow flexion and extension with pain associated. Notable instability in GHJ at ranges of flexion 90+ deg, did not stretch past this point due to instability. pt will cont to benefit from stretching and appropriate activation to improve functional mobility.    PT Next Visit Plan scar mobility, RC activation, elbow extension stretch, periscapular activation   PT Home Exercise Plan scar mobility, playdough hand work, scapular retraction, passive supination stretch-pull hand fwd to stretch elbow-active GHJ ext to pull back; table slide (move GHJ)   Consulted and Agree with Plan of Care Patient      Patient will benefit from skilled therapeutic intervention in order to improve the following deficits and impairments:     Visit Diagnosis: Right shoulder pain, unspecified chronicity  Stiffness of right shoulder, not elsewhere classified  Pain in right elbow  Stiffness of right elbow, not elsewhere classified  Muscle weakness (generalized)     Problem List Patient Active Problem List   Diagnosis Date Noted  . 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 Leukemia, lymphocytic, acute ([place]) 12/21/2012    Takina Busser C. Nikolaus Pienta PT, DPT 07/23/16 5:33 PM   [facility] [address] [place], [place], [address] Phone: [phone]   Fax:  [phone]  Name: Zohan Shiflet MRN: [id number] Date of  Birth: [birth year]

## 2016-07-25 ENCOUNTER — Ambulatory Visit: Payer: BC Managed Care – PPO | Admitting: Physical Therapy

## 2016-07-25 ENCOUNTER — Encounter: Payer: Self-pay | Admitting: Physical Therapy

## 2016-07-25 DIAGNOSIS — M25621 Stiffness of right elbow, not elsewhere classified: Secondary | ICD-10-CM

## 2016-07-25 DIAGNOSIS — M25521 Pain in right elbow: Secondary | ICD-10-CM

## 2016-07-25 DIAGNOSIS — M25511 Pain in right shoulder: Secondary | ICD-10-CM | POA: Diagnosis not present

## 2016-07-25 DIAGNOSIS — M6281 Muscle weakness (generalized): Secondary | ICD-10-CM

## 2016-07-25 DIAGNOSIS — M25611 Stiffness of right shoulder, not elsewhere classified: Secondary | ICD-10-CM

## 2016-07-25 NOTE — Therapy (Signed)
[place], [place], [address] Phone: [phone]   Fax:  [phone]  Physical Therapy Treatment  Patient Details  Name: Maurice Wright MRN: [id number] Date of Birth: Jul 07, 1995 Referring Provider: Chong Sicilian, MD  Encounter Date: 07/25/2016      PT End of Session - 07/25/16 0936    Visit Number 8   Number of Visits 30   Date for PT Re-Evaluation 09/19/16   Authorization Type BCBS- no visit limit   PT Start Time 0932   PT Stop Time 1015   PT Time Calculation (min) 43 min   Activity Tolerance Patient tolerated treatment well   Behavior During Therapy [facility] for tasks assessed/performed      Past Medical History:  Diagnosis Date  . Cancer [facility])     History reviewed. No pertinent surgical history.  There were no vitals filed for this visit.      Subjective Assessment - 07/25/16 0934    Subjective Felt good after last visit but, shoulder was a little sore from last visit.   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 No/denies   Pain Score 0-No pain            [facility] PT Assessment - 07/25/16 0001      Strength   Right Hand Grip (lbs) 39 41 46                     [facility] Adult PT Treatment/Exercise - 07/25/16 0001      Elbow Exercises   Other elbow exercises supine with cane AAROM/stretching elbow flexors     Shoulder Exercises: Supine   Internal Rotation Limitations rhythmic stab at neutral   Other Supine Exercises AROM elbow flx/ext small mid range   Other Supine Exercises flexion with cane; x 15      Shoulder Exercises: Pulleys   Flexion 3 minutes   Flexion Limitations chair scoot back to prevent elbow pain      Shoulder Exercises: Isometric Strengthening   Flexion Limitations supine resisted by PT, assisted hold for elbow flexion   External Rotation Limitations supine resisted by PT     Manual Therapy   Manual Therapy Soft tissue  mobilization   Manual therapy comments IASTM along scar with stretching of elbow into extension   Passive ROM elbow extension stretch with long duration hold; GHJ flexion                PT Education - 07/25/16 1027    Education provided Yes   Education Details HEP   Person(s) Educated Patient   Methods Explanation   Comprehension Verbalized understanding          PT Short Term Goals - 06/25/16 1312      PT SHORT TERM GOAL #1   Title GHJ PROM within 20 deg of L shoulder by 4/20   Baseline L GHJ WFL, see flowsheet   Time 4   Period Weeks   Status New     PT SHORT TERM GOAL #2   Title Pt will demo active elbow flexion to 90 deg against gravity, 2+/5 periscapular MMT by 4/20   Baseline see flowsheet   Time 4   Period Weeks   Status New     PT SHORT TERM GOAL #3   Title Pt able to perform GHJ ROM to shoulder height actively, against gravity by 5/4   Baseline 1/5 gross strength at eval  Time 6   Period Weeks   Status New     PT SHORT TERM GOAL #4   Title Full elbow extension actively by 5/4   Baseline see flowsheet   Time 6   Period Weeks   Status New     PT SHORT TERM GOAL #5   Title Pt will verbalize increased use of RUE for self care activities by 4/20   Baseline not using R arm at eval   Time 4   Period Weeks   Status New           PT Long Term Goals - 06/25/16 1317      PT LONG TERM GOAL #1   Title Pt will be able to use RUE to draw at and below shoulder height without hiking shoulder by 6/15 in order to begin return to school   Baseline unable at eval   Time 12   Period Weeks   Status New     PT LONG TERM GOAL #2   Title Pt will demo GHJ AROM against gravity within 15 deg of LUE   Baseline see flowsheet   Time 12   Period Weeks   Status New     PT LONG TERM GOAL #3   Title Pt will be able to carry & manipulate a 3lb weight in his R hand to improve functional carrying & lifting capacity   Baseline unable at eval   Time 12   Period  Weeks   Status New     PT LONG TERM GOAL #4   Title FOTO to 71% ability to indicate significant improvement in functional ability   Baseline 28% ability at eval   Time 12   Period Weeks   Status New               Plan - 07/25/16 1028    Clinical Impression Statement Pt was not experiencing any pain when he came to session. Said he felt tightness at end ranges of flexion on the pulleys. Was able to hold with more strength against resistance given by SPTA. He was able to grasp cane in supine with his hands farther apart and get to about 70-80 degrees of flexion consistantly. Pt also has greatly improved grip strength. He reports he has been using his theraputty regularly and playing video games.  Spent a lot of time working on [employer] of scar and elbow extension as pt is most concerned about his lack of elbow extension and shoulder motions. Pt seemed encouraged by his progress in grip strength.   Rehab Potential Good   PT Treatment/Interventions ADLs/Self Care Home Management;Cryotherapy;Functional mobility training;Therapeutic activities;Therapeutic exercise;Neuromuscular re-education;Patient/family education;Passive range of motion;Scar mobilization;Manual lymph drainage;Manual techniques;Taping   PT Next Visit Plan scar mobility, RC activation, elbow extension stretch, periscapular activation   PT Home Exercise Plan scar mobility, playdough hand work, scapular retraction, passive supination stretch-pull hand fwd to stretch elbow-active GHJ ext to pull back; table slide (move GHJ)   Consulted and Agree with Plan of Care Patient      Patient will benefit from skilled therapeutic intervention in order to improve the following deficits and impairments:  Decreased range of motion, Increased fascial restricitons, Impaired UE functional use, Increased muscle spasms, Decreased endurance, Decreased activity tolerance, Pain, Improper body mechanics, Impaired flexibility, Hypomobility, Decreased scar  mobility, Decreased mobility, Decreased strength, Postural dysfunction  Visit Diagnosis: Right shoulder pain, unspecified chronicity  Stiffness of right shoulder, not elsewhere classified  Pain in right elbow  Stiffness of right elbow, not elsewhere classified  Muscle weakness (generalized)     Problem List Patient Active Problem List   Diagnosis Date Noted  . 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 Leukemia, lymphocytic, acute ([place]) 12/21/2012    Janna Arch, SPTA 07/25/2016, 10:35 AM  [facility] [address] [place], [place], [address] Phone: [phone]   Fax:  [phone]  Name: Maurice Wright MRN: [id number] Date of Birth: 1996-03-15

## 2016-07-30 ENCOUNTER — Encounter: Payer: Self-pay | Admitting: Physical Therapy

## 2016-07-30 ENCOUNTER — Ambulatory Visit: Payer: BC Managed Care – PPO | Admitting: Physical Therapy

## 2016-07-30 DIAGNOSIS — M25611 Stiffness of right shoulder, not elsewhere classified: Secondary | ICD-10-CM

## 2016-07-30 DIAGNOSIS — M25511 Pain in right shoulder: Secondary | ICD-10-CM

## 2016-07-30 DIAGNOSIS — M25521 Pain in right elbow: Secondary | ICD-10-CM

## 2016-07-30 DIAGNOSIS — M25621 Stiffness of right elbow, not elsewhere classified: Secondary | ICD-10-CM

## 2016-07-30 DIAGNOSIS — M6281 Muscle weakness (generalized): Secondary | ICD-10-CM

## 2016-07-30 NOTE — Therapy (Signed)
[place], [place], [address] Phone: [phone]   Fax:  [phone]  Physical Therapy Treatment  Patient Details  Name: Maurice Wright MRN: [id number] Date of Birth: 1996/03/23 Referring Provider: Chong Sicilian, MD  Encounter Date: 07/30/2016      PT End of Session - 07/30/16 1736    Visit Number 9   Number of Visits 30   Date for PT Re-Evaluation 09/19/16   PT Start Time 6962   PT Stop Time 1722   PT Time Calculation (min) 47 min   Activity Tolerance Patient tolerated treatment well   Behavior During Therapy [facility] for tasks assessed/performed      Past Medical History:  Diagnosis Date  . Cancer ([place])     No past surgical history on file.  There were no vitals filed for this visit.      Subjective Assessment - 07/30/16 1637    Subjective "doing pretty good"   Currently in Pain? No/denies   Pain Frequency Intermittent   Aggravating Factors  straightening the elbow, leaving arm in postion for too long   Pain Relieving Factors N/A                         [facility] Adult PT Treatment/Exercise - 07/30/16 1726      Elbow Exercises   Elbow Flexion Right;15 reps;Theraband  focus on eccentric lowering    Theraband Level (Elbow Flexion) Level 1 (Yellow)  in supine   Elbow Flexion Limitations guarding arm to prevent potential loss of control   Elbow Extension Theraband;Right  focus on eccentric loading with yellow theraband   Theraband Level (Elbow Extension) Level 1 (Yellow)  in supine   Elbow Extension Limitations reported pain in the lateral aspect of the cubial fossa, utilized belt to pull proximal forearm distal as he flexed the elbow to prevent any potential impingment  reported reduce pain with gentle distraction using betl   Other elbow exercises pronation/ supination rolling on velcro board x 6, Radial deviation using velcro board 2 x 10   Other elbow exercises supine  with cane AAROM/stretching elbow flexors     Shoulder Exercises: Supine   Other Supine Exercises scapular retraction 2 x 10    Other Supine Exercises flexion with cane; x 15   made to 90 degrees     Manual Therapy   Manual therapy comments IASTM along scar with stretching of elbow into extension   Joint Mobilization Gr3 ulnar extension mobs at end range elbow extension, GHJ grade 3 anterior/ inferior mobs   Soft tissue mobilization scar mobilization proximal lateral shoulder   Passive ROM elbow extension stretch with long duration hold; GHJ flexion                  PT Short Term Goals - 06/25/16 1312      PT SHORT TERM GOAL #1   Title GHJ PROM within 20 deg of L shoulder by 4/20   Baseline L GHJ WFL, see flowsheet   Time 4   Period Weeks   Status New     PT SHORT TERM GOAL #2   Title Pt will demo active elbow flexion to 90 deg against gravity, 2+/5 periscapular MMT by 4/20   Baseline see flowsheet   Time 4   Period Weeks   Status New     PT SHORT TERM GOAL #3   Title Pt able to perform GHJ ROM to shoulder  height actively, against gravity by 5/4   Baseline 1/5 gross strength at eval   Time 6   Period Weeks   Status New     PT SHORT TERM GOAL #4   Title Full elbow extension actively by 5/4   Baseline see flowsheet   Time 6   Period Weeks   Status New     PT SHORT TERM GOAL #5   Title Pt will verbalize increased use of RUE for self care activities by 4/20   Baseline not using R arm at eval   Time 4   Period Weeks   Status New           PT Long Term Goals - 06/25/16 1317      PT LONG TERM GOAL #1   Title Pt will be able to use RUE to draw at and below shoulder height without hiking shoulder by 6/15 in order to begin return to school   Baseline unable at eval   Time 12   Period Weeks   Status New     PT LONG TERM GOAL #2   Title Pt will demo GHJ AROM against gravity within 15 deg of LUE   Baseline see flowsheet   Time 12   Period Weeks    Status New     PT LONG TERM GOAL #3   Title Pt will be able to carry & manipulate a 3lb weight in his R hand to improve functional carrying & lifting capacity   Baseline unable at eval   Time 12   Period Weeks   Status New     PT LONG TERM GOAL #4   Title FOTO to 71% ability to indicate significant improvement in functional ability   Baseline 28% ability at eval   Time 12   Period Weeks   Status New               Plan - 07/30/16 1736    Clinical Impression Statement pt continues to report no pain when coming to treatment. continued mobs for elbow / shoulder mobility. continued isometric strengthenig of the shoulder in supine and eccentric elbow strengthening, but required CGA to prevent flailing due to continued lack of control. He reported some soreness post session but declined modalities.   PT Next Visit Plan ROM/ Goals, scar mobility, RC activation, elbow extension stretch, periscapular activation   PT Home Exercise Plan scar mobility, playdough hand work, scapular retraction, passive supination stretch-pull hand fwd to stretch elbow-active GHJ ext to pull back; table slide (move GHJ)   Consulted and Agree with Plan of Care Patient      Patient will benefit from skilled therapeutic intervention in order to improve the following deficits and impairments:  Decreased range of motion, Increased fascial restricitons, Impaired UE functional use, Increased muscle spasms, Decreased endurance, Decreased activity tolerance, Pain, Improper body mechanics, Impaired flexibility, Hypomobility, Decreased scar mobility, Decreased mobility, Decreased strength, Postural dysfunction  Visit Diagnosis: Right shoulder pain, unspecified chronicity  Stiffness of right shoulder, not elsewhere classified  Pain in right elbow  Stiffness of right elbow, not elsewhere classified  Muscle weakness (generalized)     Problem List Patient Active Problem List   Diagnosis Date Noted  . Ewing  sarcoma ([place]) 05/06/2016  . Leukemia, lymphocytic, acute ([place]) 12/21/2012   Starr Lake PT, DPT, LAT, ATC  07/30/16  5:42 PM      [place] [facility] [address] [place], [place], [address] Phone: [phone]  Fax:  [phone]  Name: Copeland Neisen MRN: [id number] Date of Birth: 1996/01/12

## 2016-08-01 ENCOUNTER — Ambulatory Visit: Payer: BC Managed Care – PPO | Admitting: Physical Therapy

## 2016-08-01 ENCOUNTER — Encounter: Payer: Self-pay | Admitting: Physical Therapy

## 2016-08-01 DIAGNOSIS — M25521 Pain in right elbow: Secondary | ICD-10-CM

## 2016-08-01 DIAGNOSIS — M6281 Muscle weakness (generalized): Secondary | ICD-10-CM

## 2016-08-01 DIAGNOSIS — M25611 Stiffness of right shoulder, not elsewhere classified: Secondary | ICD-10-CM

## 2016-08-01 DIAGNOSIS — M25621 Stiffness of right elbow, not elsewhere classified: Secondary | ICD-10-CM

## 2016-08-01 DIAGNOSIS — M25511 Pain in right shoulder: Secondary | ICD-10-CM | POA: Diagnosis not present

## 2016-08-01 NOTE — Therapy (Signed)
[place], [place], [address] Phone: [phone]   Fax:  [phone]  Physical Therapy Treatment  Patient Details  Name: Neng Albee MRN: [id number] Date of Birth: 28-Jan-1996 Referring Provider: Chong Sicilian, MD  Encounter Date: 08/01/2016      PT End of Session - 08/01/16 1158    Visit Number 10   Number of Visits 30   Date for PT Re-Evaluation 09/19/16   Authorization Type BCBS- no visit limit   PT Start Time 1100   PT Stop Time 1141   PT Time Calculation (min) 41 min   Activity Tolerance Patient tolerated treatment well   Behavior During Therapy [facility] for tasks assessed/performed      Past Medical History:  Diagnosis Date  . Cancer [facility])     History reviewed. No pertinent surgical history.  There were no vitals filed for this visit.      Subjective Assessment - 08/01/16 1149    Subjective Patient is doing ok. A little sore from last time.   Currently in Pain? No/denies   Aggravating Factors  leaving arm in position for too long                         [facility] Adult PT Treatment/Exercise - 08/01/16 0001      Elbow Exercises   Elbow Flexion Right;15 reps;Theraband  focus on eccentric lowering    Theraband Level (Elbow Flexion) Level 1 (Yellow)   Elbow Flexion Limitations guarding arm to prevent loss of control   Elbow Extension Theraband;Right  focus on eccentric loading with yellow theraband   Theraband Level (Elbow Extension) Level 1 (Yellow)  in supine   Other elbow exercises pronation/ supination rolling on velcro board x 5, Radial deviation using velcro board 10; wrist flexion and extension x 5 each   Other elbow exercises supine with cane AAROM/stretching elbow flexors     Shoulder Exercises: Supine   Other Supine Exercises flexion with cane; x 15   made to 90 degrees     Shoulder Exercises: Seated   Internal Rotation 10 reps   Internal Rotation  Limitations not able to perform ER so moved arm back after each IR     Shoulder Exercises: Pulleys   Flexion 2 minutes   Flexion Limitations chair scoot back to prevent elbow pain      Manual Therapy   Manual Therapy Soft tissue mobilization   Manual therapy comments IASTM along scar with stretching of elbow into extension   Passive ROM elbow extension stretch with long duration hold; GHJ flexion                  PT Short Term Goals - 06/25/16 1312      PT SHORT TERM GOAL #1   Title GHJ PROM within 20 deg of L shoulder by 4/20   Baseline L GHJ WFL, see flowsheet   Time 4   Period Weeks   Status New     PT SHORT TERM GOAL #2   Title Pt will demo active elbow flexion to 90 deg against gravity, 2+/5 periscapular MMT by 4/20   Baseline see flowsheet   Time 4   Period Weeks   Status New     PT SHORT TERM GOAL #3   Title Pt able to perform GHJ ROM to shoulder height actively, against gravity by 5/4   Baseline 1/5 gross strength at eval   Time 6  Period Weeks   Status New     PT SHORT TERM GOAL #4   Title Full elbow extension actively by 5/4   Baseline see flowsheet   Time 6   Period Weeks   Status New     PT SHORT TERM GOAL #5   Title Pt will verbalize increased use of RUE for self care activities by 4/20   Baseline not using R arm at eval   Time 4   Period Weeks   Status New           PT Long Term Goals - 06/25/16 1317      PT LONG TERM GOAL #1   Title Pt will be able to use RUE to draw at and below shoulder height without hiking shoulder by 6/15 in order to begin return to school   Baseline unable at eval   Time 12   Period Weeks   Status New     PT LONG TERM GOAL #2   Title Pt will demo GHJ AROM against gravity within 15 deg of LUE   Baseline see flowsheet   Time 12   Period Weeks   Status New     PT LONG TERM GOAL #3   Title Pt will be able to carry & manipulate a 3lb weight in his R hand to improve functional carrying & lifting  capacity   Baseline unable at eval   Time 12   Period Weeks   Status New     PT LONG TERM GOAL #4   Title FOTO to 71% ability to indicate significant improvement in functional ability   Baseline 28% ability at eval   Time 12   Period Weeks   Status New               Plan - 08/01/16 1158    Clinical Impression Statement Pt reported no pain when coming to treatment. Worked on eccentric elbow strengthening but, CGA required to keep UE from flailing due to lack of muscular control. Was able to hold elbow positions against more resistance. Worked on strengthening of forearm and wrist via Programmer, applications. Continued IASTM of scar to increase scar mobilitiy to increase elbow extension. Patient stated he felt good after treatment.   Rehab Potential Good   PT Frequency 3x / week   PT Duration 12 weeks   PT Treatment/Interventions ADLs/Self Care Home Management;Cryotherapy;Functional mobility training;Therapeutic activities;Therapeutic exercise;Neuromuscular re-education;Patient/family education;Passive range of motion;Scar mobilization;Manual lymph drainage;Manual techniques;Taping   PT Next Visit Plan ROM/ Goals, scar mobility, RC activation, elbow extension stretch, periscapular activation   PT Home Exercise Plan scar mobility, playdough hand work, scapular retraction, passive supination stretch-pull hand fwd to stretch elbow-active GHJ ext to pull back; table slide (move GHJ)      Patient will benefit from skilled therapeutic intervention in order to improve the following deficits and impairments:  Decreased range of motion, Increased fascial restricitons, Impaired UE functional use, Increased muscle spasms, Decreased endurance, Decreased activity tolerance, Pain, Improper body mechanics, Impaired flexibility, Hypomobility, Decreased scar mobility, Decreased mobility, Decreased strength, Postural dysfunction  Visit Diagnosis: Right shoulder pain, unspecified chronicity  Stiffness of right  shoulder, not elsewhere classified  Pain in right elbow  Stiffness of right elbow, not elsewhere classified  Muscle weakness (generalized)     Problem List Patient Active Problem List   Diagnosis Date Noted  . 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 Leukemia, lymphocytic, acute ([place]) 12/21/2012    Janna Arch, SPTA 08/01/2016, 12:07 PM  [place], [place], [address] Phone: [phone]   Fax:  [phone]  Name: Herny Scurlock MRN: [id number] Date of Birth: 08/01/1995

## 2016-08-08 ENCOUNTER — Ambulatory Visit: Payer: BC Managed Care – PPO | Attending: Orthopedic Surgery | Admitting: Physical Therapy

## 2016-08-08 ENCOUNTER — Encounter: Payer: Self-pay | Admitting: Physical Therapy

## 2016-08-08 DIAGNOSIS — M25521 Pain in right elbow: Secondary | ICD-10-CM | POA: Diagnosis present

## 2016-08-08 DIAGNOSIS — M25511 Pain in right shoulder: Secondary | ICD-10-CM | POA: Diagnosis not present

## 2016-08-08 DIAGNOSIS — M25611 Stiffness of right shoulder, not elsewhere classified: Secondary | ICD-10-CM | POA: Diagnosis present

## 2016-08-08 DIAGNOSIS — M6281 Muscle weakness (generalized): Secondary | ICD-10-CM | POA: Insufficient documentation

## 2016-08-08 DIAGNOSIS — M25621 Stiffness of right elbow, not elsewhere classified: Secondary | ICD-10-CM | POA: Diagnosis present

## 2016-08-08 NOTE — Therapy (Signed)
[place], [place], [address] Phone: [phone]   Fax:  [phone]  Physical Therapy Treatment  Patient Details  Name: Maurice Wright MRN: [id number] Date of Birth: 1995/07/06 Referring Provider: Chong Sicilian, MD  Encounter Date: 08/08/2016      PT End of Session - 08/08/16 0800    Visit Number 11   Number of Visits 30   Date for PT Re-Evaluation 09/19/16   Authorization Type BCBS- no visit limit   PT Start Time 0757   PT Stop Time 0841   PT Time Calculation (min) 44 min   Activity Tolerance Patient tolerated treatment well   Behavior During Therapy [facility] for tasks assessed/performed      Past Medical History:  Diagnosis Date  . Cancer [facility])     History reviewed. No pertinent surgical history.  There were no vitals filed for this visit.      Subjective Assessment - 08/08/16 0800    Subjective Patient is doing ok other than his shoulder being a little sore from last time. Tired from his early morning appointment and from his allergies.   Currently in Pain? Yes   Pain Score 2    Pain Location Elbow   Pain Orientation Right   Pain Type Surgical pain   Aggravating Factors  arm in wrong position for too long                         [facility] Adult PT Treatment/Exercise - 08/08/16 0001      Elbow Exercises   Elbow Flexion Right;15 reps;Theraband  focus on eccentric lowering    Theraband Level (Elbow Flexion) Level 1 (Yellow)   Elbow Flexion Limitations guarding arm to prevent loss of control   Elbow Extension Theraband;Right  focus on eccentric loading with yellow theraband   Theraband Level (Elbow Extension) Level 1 (Yellow)   Other elbow exercises ulnar/radial deviation with 1 lb weight sitting; 20x     Shoulder Exercises: Supine   Internal Rotation Limitations rhythmic stab at neutral   Other Supine Exercises flexion with cane; x 15   made to 90 degrees     Shoulder Exercises: Seated   Internal Rotation 10 reps   Internal Rotation Weight (lbs) 1 lb   Other Seated Exercises bicep curls; 1 lb; 15 reps; limited AROM     Shoulder Exercises: Pulleys   Flexion 3 minutes   Flexion Limitations chair scoot back to prevent elbow pain      Manual Therapy   Manual Therapy Soft tissue mobilization;Passive ROM   Manual therapy comments IASTM along scar with stretching of elbow into extension   Passive ROM elbow extension stretch with long duration hold; GHJ flexion                  PT Short Term Goals - 06/25/16 1312      PT SHORT TERM GOAL #1   Title GHJ PROM within 20 deg of L shoulder by 4/20   Baseline L GHJ WFL, see flowsheet   Time 4   Period Weeks   Status New     PT SHORT TERM GOAL #2   Title Pt will demo active elbow flexion to 90 deg against gravity, 2+/5 periscapular MMT by 4/20   Baseline see flowsheet   Time 4   Period Weeks   Status New     PT SHORT TERM GOAL #3   Title Pt able to  perform GHJ ROM to shoulder height actively, against gravity by 5/4   Baseline 1/5 gross strength at eval   Time 6   Period Weeks   Status New     PT SHORT TERM GOAL #4   Title Full elbow extension actively by 5/4   Baseline see flowsheet   Time 6   Period Weeks   Status New     PT SHORT TERM GOAL #5   Title Pt will verbalize increased use of RUE for self care activities by 4/20   Baseline not using R arm at eval   Time 4   Period Weeks   Status New           PT Long Term Goals - 08/08/16 1103      PT LONG TERM GOAL #1   Title Pt will be able to use RUE to draw at and below shoulder height without hiking shoulder by 6/15 in order to begin return to school   Status Unable to assess     PT LONG TERM GOAL #2   Title Pt will demo GHJ AROM against gravity within 15 deg of LUE   Status Unable to assess     PT LONG TERM GOAL #3   Title Pt will be able to carry & manipulate a 3lb weight in his R hand to improve functional  carrying & lifting capacity   Baseline patient able to manipulate a 1lb weight   Time 12   Period Weeks   Status On-going     PT LONG TERM GOAL #4   Title FOTO to 71% ability to indicate significant improvement in functional ability   Time 12   Period Weeks   Status Unable to assess               Plan - 08/08/16 1056    Clinical Impression Statement Patient reported his shoulder is a little bit sore. He is unsure as to whether or not that is because of PT or other things. Worked on elbow and shoulder strengthening. Able to move his forearm around with a 1 lb weight, with limited ROM. Able to hold against rthymic stabilization. Used weight to work on ulnar and radial deviation. Continued IASTM along scar and adjacent muscles.    Rehab Potential Good   PT Treatment/Interventions ADLs/Self Care Home Management;Cryotherapy;Functional mobility training;Therapeutic activities;Therapeutic exercise;Neuromuscular re-education;Patient/family education;Passive range of motion;Scar mobilization;Manual lymph drainage;Manual techniques;Taping   PT Next Visit Plan ROM/ Goals, scar mobility, RC activation, elbow extension stretch, periscapular activation   PT Home Exercise Plan scar mobility, playdough hand work, scapular retraction, passive supination stretch-pull hand fwd to stretch elbow-active GHJ ext to pull back; table slide (move GHJ)   Consulted and Agree with Plan of Care Patient      Patient will benefit from skilled therapeutic intervention in order to improve the following deficits and impairments:  Decreased range of motion, Increased fascial restricitons, Impaired UE functional use, Increased muscle spasms, Decreased endurance, Decreased activity tolerance, Pain, Improper body mechanics, Impaired flexibility, Hypomobility, Decreased scar mobility, Decreased mobility, Decreased strength, Postural dysfunction  Visit Diagnosis: Right shoulder pain, unspecified chronicity  Stiffness of  right shoulder, not elsewhere classified  Pain in right elbow  Stiffness of right elbow, not elsewhere classified  Muscle weakness (generalized)     Problem List Patient Active Problem List   Diagnosis Date Noted  . 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 Leukemia, lymphocytic, acute ([place]) 12/21/2012    Janna Arch, SPTA 08/08/2016, 11:05  AM  [facility] [address] [place], [place], [address] Phone: [phone]   Fax:  [phone]  Name: Maurice Wright MRN: [id number] Date of Birth: August 01, 1995

## 2016-08-12 ENCOUNTER — Ambulatory Visit: Payer: BC Managed Care – PPO | Admitting: Physical Therapy

## 2016-08-14 ENCOUNTER — Ambulatory Visit: Payer: BC Managed Care – PPO | Admitting: Physical Therapy

## 2016-08-14 DIAGNOSIS — M25621 Stiffness of right elbow, not elsewhere classified: Secondary | ICD-10-CM

## 2016-08-14 DIAGNOSIS — M6281 Muscle weakness (generalized): Secondary | ICD-10-CM

## 2016-08-14 DIAGNOSIS — M25611 Stiffness of right shoulder, not elsewhere classified: Secondary | ICD-10-CM

## 2016-08-14 DIAGNOSIS — M25521 Pain in right elbow: Secondary | ICD-10-CM

## 2016-08-14 DIAGNOSIS — M25511 Pain in right shoulder: Secondary | ICD-10-CM

## 2016-08-15 NOTE — Therapy (Signed)
[place], [place], [address] Phone: [phone]   Fax:  [phone]  Physical Therapy Treatment  Patient Details  Name: Maurice Wright MRN: [id number] Date of Birth: [birth year] Referring Provider: Chong Sicilian, MD  Encounter Date: 08/14/2016      PT End of Session - 08/15/16 0736    Visit Number 12   Number of Visits 30   Date for PT Re-Evaluation 09/19/16   Authorization Type BCBS- no visit limit   PT Start Time 0930   PT Stop Time 1015   PT Time Calculation (min) 45 min      Past Medical History:  Diagnosis Date  . Cancer ([place])     No past surgical history on file.  There were no vitals filed for this visit.      Subjective Assessment - 08/14/16 0935    Subjective Not too sore after last visit. still stretcing.    Currently in Pain? No/denies                         [facility] Adult PT Treatment/Exercise - 08/14/16 0001      Elbow Exercises   Other elbow exercises pronation/ supination rolling on velcro board x 5, Radial deviation using velcro board 10; wrist flexion and extension x 5 each, key turns on velcro pad     Shoulder Exercises: Supine   Internal Rotation Limitations rhythmic stab at neutral     Shoulder Exercises: Seated   Other Seated Exercises bicep curls; 1 lb; 15 reps; limited AROM     Shoulder Exercises: ROM/Strengthening   UBE (Upper Arm Bike) Nustep UE only L1 x 5 minutes, focuing on using RUE, min pain at elbow with elbow extension     Shoulder Exercises: Isometric Strengthening   External Rotation Limitations supine resisted by PTA   Internal Rotation --  supine resisted by PTA     Manual: Soft tissue mobilization to right bicep with prolonged stretching              PT Short Term Goals - 06/25/16 1312      PT SHORT TERM GOAL #1   Title GHJ PROM within 20 deg of L shoulder by 4/20   Baseline L GHJ WFL, see flowsheet   Time 4   Period Weeks   Status New     PT SHORT TERM GOAL #2   Title Pt will demo active elbow flexion to 90 deg against gravity, 2+/5 periscapular MMT by 4/20   Baseline see flowsheet   Time 4   Period Weeks   Status New     PT SHORT TERM GOAL #3   Title Pt able to perform GHJ ROM to shoulder height actively, against gravity by 5/4   Baseline 1/5 gross strength at eval   Time 6   Period Weeks   Status New     PT SHORT TERM GOAL #4   Title Full elbow extension actively by 5/4   Baseline see flowsheet   Time 6   Period Weeks   Status New     PT SHORT TERM GOAL #5   Title Pt will verbalize increased use of RUE for self care activities by 4/20   Baseline not using R arm at eval   Time 4   Period Weeks   Status New           PT Long Term Goals - 08/08/16 1103  PT LONG TERM GOAL #1   Title Pt will be able to use RUE to draw at and below shoulder height without hiking shoulder by 6/15 in order to begin return to school   Status Unable to assess     PT LONG TERM GOAL #2   Title Pt will demo GHJ AROM against gravity within 15 deg of LUE   Status Unable to assess     PT LONG TERM GOAL #3   Title Pt will be able to carry & manipulate a 3lb weight in his R hand to improve functional carrying & lifting capacity   Baseline patient able to manipulate a 1lb weight   Time 12   Period Weeks   Status On-going     PT LONG TERM GOAL #4   Title FOTO to 71% ability to indicate significant improvement in functional ability   Time 12   Period Weeks   Status Unable to assess               Plan - 08/14/16 0935    Clinical Impression Statement Pt reports stiffness in elbow after prolonged positioning and during extended stretches. Worked on UE strengthening and Elbow  extension ROM by soft tisuue work for lengthening to biceps . Elbw extension ROM unchanged.    PT Next Visit Plan ROM/ Goals, scar mobility, RC activation, elbow extension stretch, periscapular activation   PT  Home Exercise Plan scar mobility, playdough hand work, scapular retraction, passive supination stretch-pull hand fwd to stretch elbow-active GHJ ext to pull back; table slide (move GHJ)   Consulted and Agree with Plan of Care Patient      Patient will benefit from skilled therapeutic intervention in order to improve the following deficits and impairments:  Decreased range of motion, Increased fascial restricitons, Impaired UE functional use, Increased muscle spasms, Decreased endurance, Decreased activity tolerance, Pain, Improper body mechanics, Impaired flexibility, Hypomobility, Decreased scar mobility, Decreased mobility, Decreased strength, Postural dysfunction  Visit Diagnosis: Right shoulder pain, unspecified chronicity  Stiffness of right shoulder, not elsewhere classified  Pain in right elbow  Stiffness of right elbow, not elsewhere classified  Muscle weakness (generalized)     Problem List Patient Active Problem List   Diagnosis Date Noted  . 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 Leukemia, lymphocytic, acute ([place]) 12/21/2012    Dorene Ar, PTA 08/15/2016, 7:41 AM  [place], [place], [address] Phone: [phone]   Fax:  [phone]  Name: Maurice Wright MRN: [id number] Date of Birth: May 20, 1995

## 2016-08-19 ENCOUNTER — Ambulatory Visit: Payer: BC Managed Care – PPO | Admitting: Physical Therapy

## 2016-08-19 ENCOUNTER — Encounter: Payer: Self-pay | Admitting: Physical Therapy

## 2016-08-19 DIAGNOSIS — M6281 Muscle weakness (generalized): Secondary | ICD-10-CM

## 2016-08-19 DIAGNOSIS — M25511 Pain in right shoulder: Secondary | ICD-10-CM | POA: Diagnosis not present

## 2016-08-19 DIAGNOSIS — M25611 Stiffness of right shoulder, not elsewhere classified: Secondary | ICD-10-CM

## 2016-08-19 DIAGNOSIS — M25521 Pain in right elbow: Secondary | ICD-10-CM

## 2016-08-19 DIAGNOSIS — M25621 Stiffness of right elbow, not elsewhere classified: Secondary | ICD-10-CM

## 2016-08-19 NOTE — Therapy (Signed)
[place], [place], [address] Phone: [phone]   Fax:  [phone]  Physical Therapy Treatment  Patient Details  Name: Maurice Wright MRN: [id number] Date of Birth: [birth year] Referring Provider: Chong Sicilian, MD  Encounter Date: 08/19/2016      PT End of Session - 08/19/16 1313    Visit Number 13   Number of Visits 30   Date for PT Re-Evaluation 09/19/16   PT Start Time 1100   PT Stop Time 1150   PT Time Calculation (min) 50 min   Activity Tolerance Patient tolerated treatment well   Behavior During Therapy [facility] for tasks assessed/performed      Past Medical History:  Diagnosis Date  . Cancer [facility])     History reviewed. No pertinent surgical history.  There were no vitals filed for this visit.      Subjective Assessment - 08/19/16 1107    Subjective "no issues, feeling like I am getting stronger"    Currently in Pain? No/denies            [facility] PT Assessment - 08/19/16 1109      Observation/Other Assessments   Focus on Therapeutic Outcomes (FOTO)  54% limited     AROM   Right Shoulder Extension 44 Degrees   Right Shoulder Flexion 26 Degrees  sliding arm along leg   Right Shoulder ABduction 20 Degrees   Right Shoulder Internal Rotation --  to stomach   Right Shoulder External Rotation --  from stomach to -60 from neutral    Right Elbow Flexion 52   Right Elbow Extension -24     PROM   Right Elbow Flexion 84     Strength   Right Hand Grip (lbs) 44.3  45,40,48                     [facility] Adult PT Treatment/Exercise - 08/19/16 1310      Elbow Exercises   Other elbow exercises eccentric bicep curl 2 x 10, eccentric tricep extension in supine with yellow theraband  CGA PRN     Shoulder Exercises: Seated   Row Strengthening;AAROM;Right;10 reps  2  sets with UE ranger using yellow theraband   Row Limitations CGA PRN to prevent abarrent movement   Other  Seated Exercises UE ranger protraction (CGA PRN to prevent abberrant uncontrolled movement) 2 x 10     Shoulder Exercises: Standing   Other Standing Exercises wall walks with controlled eccentri lower for flexion 1 x 8   CGA PRN to prevent abbarent movement     Manual Therapy   Passive ROM elbow extension stretch,GHJ flexion                  PT Short Term Goals - 06/25/16 1312      PT SHORT TERM GOAL #1   Title GHJ PROM within 20 deg of L shoulder by 4/20   Baseline L GHJ WFL, see flowsheet   Time 4   Period Weeks   Status New     PT SHORT TERM GOAL #2   Title Pt will demo active elbow flexion to 90 deg against gravity, 2+/5 periscapular MMT by 4/20   Baseline see flowsheet   Time 4   Period Weeks   Status New     PT SHORT TERM GOAL #3   Title Pt able to perform GHJ ROM to shoulder height actively, against gravity by 5/4   Baseline 1/5 gross  strength at eval   Time 6   Period Weeks   Status New     PT SHORT TERM GOAL #4   Title Full elbow extension actively by 5/4   Baseline see flowsheet   Time 6   Period Weeks   Status New     PT SHORT TERM GOAL #5   Title Pt will verbalize increased use of RUE for self care activities by 4/20   Baseline not using R arm at eval   Time 4   Period Weeks   Status New           PT Long Term Goals - 08/08/16 1103      PT LONG TERM GOAL #1   Title Pt will be able to use RUE to draw at and below shoulder height without hiking shoulder by 6/15 in order to begin return to school   Status Unable to assess     PT LONG TERM GOAL #2   Title Pt will demo GHJ AROM against gravity within 15 deg of LUE   Status Unable to assess     PT LONG TERM GOAL #3   Title Pt will be able to carry & manipulate a 3lb weight in his R hand to improve functional carrying & lifting capacity   Baseline patient able to manipulate a 1lb weight   Time 12   Period Weeks   Status On-going     PT LONG TERM GOAL #4   Title FOTO to 71% ability  to indicate significant improvement in functional ability   Time 12   Period Weeks   Status Unable to assess               Plan - 08/19/16 1313    Clinical Impression Statement reports no pain just continued stiffness at the elbow. He is progress with shoulder mobility and elbow flexion but continues to demonstrate limited elbow extension.  continued with work on Corporate treasurer for protraction/ and row exercise and standing wall walks with eccentric lowering. He is improving with control of his forearm but conitnues to require CGA PRN to prevent uncontrolled abbarent movement. utilized ice pack post session due to soreness.    PT Next Visit Plan goals, scar mobility, RC activation, elbow extension stretch, periscapular activation   PT Home Exercise Plan scar mobility, playdough hand work, scapular retraction, passive supination stretch-pull hand fwd to stretch elbow-active GHJ ext to pull back; table slide (move GHJ)   Consulted and Agree with Plan of Care Patient      Patient will benefit from skilled therapeutic intervention in order to improve the following deficits and impairments:  Decreased range of motion, Increased fascial restricitons, Impaired UE functional use, Increased muscle spasms, Decreased endurance, Decreased activity tolerance, Pain, Improper body mechanics, Impaired flexibility, Hypomobility, Decreased scar mobility, Decreased mobility, Decreased strength, Postural dysfunction  Visit Diagnosis: Right shoulder pain, unspecified chronicity  Stiffness of right shoulder, not elsewhere classified  Pain in right elbow  Stiffness of right elbow, not elsewhere classified  Muscle weakness (generalized)     Problem List Patient Active Problem List   Diagnosis Date Noted  . 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 Leukemia, lymphocytic, acute ([place]) 12/21/2012   Starr Lake PT, DPT, LAT, ATC  08/19/16  1:19 PM      [place] [facility] [address] [place], [place], [address] Phone: [phone]   Fax:  [phone]  Name: Maurice Wright MRN: [id number]  Date of Birth: 1995-06-06

## 2016-08-21 ENCOUNTER — Ambulatory Visit: Payer: BC Managed Care – PPO | Admitting: Physical Therapy

## 2016-08-21 DIAGNOSIS — M25521 Pain in right elbow: Secondary | ICD-10-CM

## 2016-08-21 DIAGNOSIS — M25511 Pain in right shoulder: Secondary | ICD-10-CM | POA: Diagnosis not present

## 2016-08-21 DIAGNOSIS — M25611 Stiffness of right shoulder, not elsewhere classified: Secondary | ICD-10-CM

## 2016-08-21 DIAGNOSIS — M6281 Muscle weakness (generalized): Secondary | ICD-10-CM

## 2016-08-21 DIAGNOSIS — M25621 Stiffness of right elbow, not elsewhere classified: Secondary | ICD-10-CM

## 2016-08-21 NOTE — Therapy (Signed)
[place], [place], [address] Phone: [phone]   Fax:  [phone]  Physical Therapy Treatment  Patient Details  Name: Maurice Wright MRN: [id number] Date of Birth: 08-27-1995 Referring Provider: Chong Sicilian, MD  Encounter Date: 08/21/2016      PT End of Session - 08/21/16 1111    Visit Number 14   Number of Visits 30   Date for PT Re-Evaluation 09/19/16   Authorization Type BCBS- no visit limit   PT Start Time 1105  session begun by Hessie Diener, PTA   PT Stop Time 1145   PT Time Calculation (min) 40 min   Activity Tolerance Patient tolerated treatment well   Behavior During Therapy [facility] for tasks assessed/performed      Past Medical History:  Diagnosis Date  . Cancer ([place])     No past surgical history on file.  There were no vitals filed for this visit.                       [place] Adult PT Treatment/Exercise - 08/21/16 0001      Shoulder Exercises: Supine   Other Supine Exercises biceps curl to PT resisted triceps extension   Other Supine Exercises IR/ER small range from neutral in 90 biceps curl     Shoulder Exercises: Seated   ABduction Limitations abduction roll on pink foam roller, thumb up   Other Seated Exercises pronation/supination holding therabar   Other Seated Exercises biceps curls, in supination     Shoulder Exercises: Pulleys   Flexion 3 minutes     Shoulder Exercises: ROM/Strengthening   Other ROM/Strengthening Exercises wall ladder x10-PT guard   Other ROM/Strengthening Exercises UE ranger- multidirectional and circles     Manual Therapy   Joint Mobilization AP mobs to encourage extension   Soft tissue mobilization scar mobilization and stretching   Passive ROM elbow extension                  PT Short Term Goals - 08/21/16 1314      PT SHORT TERM GOAL #1   Title GHJ PROM within 20 deg of L shoulder by 4/20   Status Unable  to assess     PT SHORT TERM GOAL #2   Title Pt will demo active elbow flexion to 90 deg against gravity, 2+/5 periscapular MMT by 4/20   Baseline able to demo active elbow flexion, MMT to be tested   Status Partially Met     PT SHORT TERM GOAL #3   Title Pt able to perform GHJ ROM to shoulder height actively, against gravity by 5/4   Baseline unable at thistime   Status On-going     PT SHORT TERM GOAL #4   Title Full elbow extension actively by 5/4   Baseline -26   Status On-going     PT SHORT TERM GOAL #5   Title Pt will verbalize increased use of RUE for self care activities by 4/20   Baseline unable to eat but is able to use for dressing and some drawing   Status Partially Met           PT Long Term Goals - 08/08/16 1103      PT LONG TERM GOAL #1   Title Pt will be able to use RUE to draw at and below shoulder height without hiking shoulder by 6/15 in order to begin return to school   Status Unable to  assess     PT LONG TERM GOAL #2   Title Pt will demo GHJ AROM against gravity within 15 deg of LUE   Status Unable to assess     PT LONG TERM GOAL #3   Title Pt will be able to carry & manipulate a 3lb weight in his R hand to improve functional carrying & lifting capacity   Baseline patient able to manipulate a 1lb weight   Time 12   Period Weeks   Status On-going     PT LONG TERM GOAL #4   Title FOTO to 71% ability to indicate significant improvement in functional ability   Time 12   Period Weeks   Status Unable to assess               Plan - 08/21/16 1310    Clinical Impression Statement pt demo improved control of GHJ but still requires guarding from PT. able to perform biceps curl with min assist to maintain neutral GHJ rotation through motion. pt denies pain, only reporting that the superior portion of incision is bothersome when stretched.    PT Next Visit Plan goals, scar mobility, RC activation, elbow extension stretch, periscapular activation    PT Home Exercise Plan scar mobility, playdough hand work, scapular retraction, passive supination stretch-pull hand fwd to stretch elbow-active GHJ ext to pull back; table slide (move GHJ)   Consulted and Agree with Plan of Care Patient      Patient will benefit from skilled therapeutic intervention in order to improve the following deficits and impairments:     Visit Diagnosis: Right shoulder pain, unspecified chronicity  Stiffness of right shoulder, not elsewhere classified  Pain in right elbow  Stiffness of right elbow, not elsewhere classified  Muscle weakness (generalized)     Problem List Patient Active Problem List   Diagnosis Date Noted  . 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 Leukemia, lymphocytic, acute ([place]) 12/21/2012    Ryn Peine C. Destry Dauber PT, DPT 08/21/16 1:17 PM   [facility] [address] [place], [place], [address] Phone: [phone]   Fax:  [phone]  Name: Maurice Wright MRN: [id number] Date of Birth: 1995/07/15

## 2016-08-25 ENCOUNTER — Ambulatory Visit: Payer: BC Managed Care – PPO | Admitting: Physical Therapy

## 2016-08-25 ENCOUNTER — Encounter: Payer: Self-pay | Admitting: Physical Therapy

## 2016-08-25 DIAGNOSIS — M25511 Pain in right shoulder: Secondary | ICD-10-CM | POA: Diagnosis not present

## 2016-08-25 DIAGNOSIS — M25621 Stiffness of right elbow, not elsewhere classified: Secondary | ICD-10-CM

## 2016-08-25 DIAGNOSIS — M25521 Pain in right elbow: Secondary | ICD-10-CM

## 2016-08-25 DIAGNOSIS — M6281 Muscle weakness (generalized): Secondary | ICD-10-CM

## 2016-08-25 DIAGNOSIS — M25611 Stiffness of right shoulder, not elsewhere classified: Secondary | ICD-10-CM

## 2016-08-25 NOTE — Therapy (Signed)
[place], [place], [address] Phone: [phone]   Fax:  [phone]  Physical Therapy Treatment  Patient Details  Name: Maurice Wright MRN: [id number] Date of Birth: 1996-03-17 Referring Provider: Chong Sicilian, MD  Encounter Date: 08/25/2016      PT End of Session - 08/25/16 1518    Visit Number 15   Number of Visits 30   Date for PT Re-Evaluation 09/19/16   PT Start Time 1500   PT Stop Time 1552   PT Time Calculation (min) 52 min   Activity Tolerance Patient tolerated treatment well   Behavior During Therapy [facility] for tasks assessed/performed      Past Medical History:  Diagnosis Date  . Cancer [facility])     History reviewed. No pertinent surgical history.  There were no vitals filed for this visit.      Subjective Assessment - 08/25/16 1504    Subjective "I still have tightness in the arm"   Currently in Pain? No/denies   Aggravating Factors  N/A   Pain Relieving Factors N/A            [facility] PT Assessment - 08/25/16 0001      AROM   Right Elbow Extension -19                     [facility] Adult PT Treatment/Exercise - 08/25/16 0001      Elbow Exercises   Other elbow exercises bicep brachii contract/ relax stretching 3 x 30 sec hold with 10 sec contraction     Shoulder Exercises: Seated   Row Strengthening;AAROM;Right;10 reps   Row Limitations CGA PRN to prevent abarrent movement 1 x 10  verbal cues to keep back against chair to avoid rotation     Shoulder Exercises: Standing   Protraction AAROM;Strengthening;Right  using UE ranger   External Rotation AAROM;Strengthening;Right;10 reps   Theraband Level (Shoulder External Rotation) Other (comment)  using UE ranger   Internal Rotation AAROM;Strengthening;Right;10 reps;Other (comment)  using UE ranger     Cryotherapy   Number Minutes Cryotherapy 10 Minutes   Cryotherapy Location Shoulder;Upper arm   Type of  Cryotherapy Ice pack  in supine with sustain LLLD stretch      Manual Therapy   Manual therapy comments IASTM along bicep   Joint Mobilization A>P mobs of the elbow  to increase extension   Soft tissue mobilization scar mobilization and stretching   Passive ROM elbow extension          Trigger Point Dry Needling - 08/25/16 1515    Consent Given? Yes   Education Handout Provided Yes   Muscles Treated Upper Body --  common extensors on the R,  biceps brachii and brachioradial              PT Education - 08/25/16 1517    Education provided Yes   Education Details anatomy of the muscles and referral patterns. what TPDN is, benefits, and after care.    Person(s) Educated Patient   Methods Explanation;Verbal cues   Comprehension Verbalized understanding;Verbal cues required          PT Short Term Goals - 08/25/16 1518      PT SHORT TERM GOAL #1   Title GHJ PROM within 20 deg of L shoulder by 4/20   Time 4   Period Weeks   Status Partially Met     PT SHORT TERM GOAL #2   Title Pt will demo  active elbow flexion to 90 deg against gravity, 2+/5 periscapular MMT by 4/20   Time 4   Period Weeks   Status Achieved     PT SHORT TERM GOAL #3   Title Pt able to perform GHJ ROM to shoulder height actively, against gravity by 5/4   Time 6   Period Weeks   Status On-going     PT SHORT TERM GOAL #4   Title Full elbow extension actively by 5/4   Time 6   Period Weeks   Status On-going     PT SHORT TERM GOAL #5   Title Pt will verbalize increased use of RUE for self care activities by 4/20   Time 4   Period Weeks   Status Partially Met           PT Long Term Goals - 08/25/16 1519      PT LONG TERM GOAL #1   Title Pt will be able to use RUE to draw at and below shoulder height without hiking shoulder by 6/15 in order to begin return to school   Time 12   Period Weeks   Status On-going     PT LONG TERM GOAL #2   Title Pt will demo GHJ AROM against gravity  within 15 deg of LUE   Time 12   Period Weeks   Status On-going     PT LONG TERM GOAL #3   Title Pt will be able to carry & manipulate a 3lb weight in his R hand to improve functional carrying & lifting capacity   Time 12   Period Weeks   Status On-going     PT LONG TERM GOAL #4   Title FOTO to 71% ability to indicate significant improvement in functional ability   Time 12   Period Weeks   Status On-going               Plan - 08/25/16 1629    Clinical Impression Statement pt continues to report no pain. explained TPDN and performed on  R biceps Brachii, brachioradialis and common extensors on the R. performed IASTM and scar tissue massage techniques which assist with pt getting -19 degrees of ext. continued strengthing with UE ranger which he requires tactile cues to prevent trunk rotation. utilized ice post session for soreness. he is progressing well with goals.    PT Next Visit Plan assess response to TPDN, scar mobility, RC activation, elbow extension stretch, periscapular activation      Patient will benefit from skilled therapeutic intervention in order to improve the following deficits and impairments:     Visit Diagnosis: Right shoulder pain, unspecified chronicity  Stiffness of right shoulder, not elsewhere classified  Pain in right elbow  Stiffness of right elbow, not elsewhere classified  Muscle weakness (generalized)     Problem List Patient Active Problem List   Diagnosis Date Noted  . 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 Leukemia, lymphocytic, acute ([place]) 12/21/2012   Starr Lake PT, DPT, LAT, ATC  08/25/16  4:32 PM      [facility] [address] [place], [place], [address] Phone: [phone]   Fax:  [phone]  Name: Keanon Bevins MRN: [id number] Date of Birth: 1995/05/11

## 2016-08-27 ENCOUNTER — Ambulatory Visit: Payer: BC Managed Care – PPO | Admitting: Physical Therapy

## 2016-08-27 ENCOUNTER — Encounter: Payer: Self-pay | Admitting: Physical Therapy

## 2016-08-27 DIAGNOSIS — M25611 Stiffness of right shoulder, not elsewhere classified: Secondary | ICD-10-CM

## 2016-08-27 DIAGNOSIS — M6281 Muscle weakness (generalized): Secondary | ICD-10-CM

## 2016-08-27 DIAGNOSIS — M25621 Stiffness of right elbow, not elsewhere classified: Secondary | ICD-10-CM

## 2016-08-27 DIAGNOSIS — M25521 Pain in right elbow: Secondary | ICD-10-CM

## 2016-08-27 DIAGNOSIS — M25511 Pain in right shoulder: Secondary | ICD-10-CM

## 2016-08-27 NOTE — Therapy (Signed)
[place], [place], [address] Phone: [phone]   Fax:  [phone]  Physical Therapy Treatment  Patient Details  Name: Aziz Slape MRN: [id number] Date of Birth: 08/19/1995 Referring Provider: Chong Sicilian, MD  Encounter Date: 08/27/2016      PT End of Session - 08/27/16 0934    Visit Number 16   Number of Visits 30   Date for PT Re-Evaluation 09/19/16   Authorization Type BCBS- no visit limit   PT Start Time 0934   PT Stop Time 1012   PT Time Calculation (min) 38 min   Activity Tolerance Patient tolerated treatment well   Behavior During Therapy [facility] for tasks assessed/performed      Past Medical History:  Diagnosis Date  . Cancer [facility])     History reviewed. No pertinent surgical history.  There were no vitals filed for this visit.      Subjective Assessment - 08/27/16 0934    Subjective Not too sore after needling.    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 No/denies                         [facility] Adult PT Treatment/Exercise - 08/27/16 0001      Shoulder Exercises: Supine   External Rotation Limitations ER/IR, small range at neutral abd-PT guard   Other Supine Exercises resisted elbow extension     Shoulder Exercises: Seated   External Rotation Limitations iso press into yellow tband with attempted elbow flexion; hands rested yellow resisted ER     Shoulder Exercises: Pulleys   Flexion 3 minutes     Shoulder Exercises: ROM/Strengthening   Other ROM/Strengthening Exercises UE ranger- circles, flx& row, ER reach   Other ROM/Strengthening Exercises wall ladder     Manual Therapy   Joint Mobilization A>P mobs of the elbow   Soft tissue mobilization scar mobilization, proximal wrist flexors                PT Education - 08/27/16 0935    Education provided Yes   Education Details exercise form/rationale   Person(s)  Educated Patient   Methods Explanation;Demonstration;Tactile cues;Verbal cues   Comprehension Verbalized understanding;Returned demonstration;Tactile cues required;Verbal cues required;Need further instruction          PT Short Term Goals - 08/25/16 1518      PT SHORT TERM GOAL #1   Title GHJ PROM within 20 deg of L shoulder by 4/20   Time 4   Period Weeks   Status Partially Met     PT SHORT TERM GOAL #2   Title Pt will demo active elbow flexion to 90 deg against gravity, 2+/5 periscapular MMT by 4/20   Time 4   Period Weeks   Status Achieved     PT SHORT TERM GOAL #3   Title Pt able to perform GHJ ROM to shoulder height actively, against gravity by 5/4   Time 6   Period Weeks   Status On-going     PT SHORT TERM GOAL #4   Title Full elbow extension actively by 5/4   Time 6   Period Weeks   Status On-going     PT SHORT TERM GOAL #5   Title Pt will verbalize increased use of RUE for self care activities by 4/20   Time 4   Period Weeks   Status Partially Met  PT Long Term Goals - 08/25/16 1519      PT LONG TERM GOAL #1   Title Pt will be able to use RUE to draw at and below shoulder height without hiking shoulder by 6/15 in order to begin return to school   Time 12   Period Weeks   Status On-going     PT LONG TERM GOAL #2   Title Pt will demo GHJ AROM against gravity within 15 deg of LUE   Time 12   Period Weeks   Status On-going     PT LONG TERM GOAL #3   Title Pt will be able to carry & manipulate a 3lb weight in his R hand to improve functional carrying & lifting capacity   Time 12   Period Weeks   Status On-going     PT LONG TERM GOAL #4   Title FOTO to 71% ability to indicate significant improvement in functional ability   Time 12   Period Weeks   Status On-going               Plan - 08/27/16 1014    Clinical Impression Statement At this time pt reports feeling most limited in his ability to reach laterally. Does not feel  that elbow ROM is limiting his functional ability. Elbow ext ROM to -28 today. Discussed decreasing guarded position to improve stretch of biceps.    PT Next Visit Plan DN-encourage stretching   PT Home Exercise Plan scar mobility, playdough hand work, scapular retraction, passive supination stretch-pull hand fwd to stretch elbow-active GHJ ext to pull back; table slide (move GHJ)   Consulted and Agree with Plan of Care Patient      Patient will benefit from skilled therapeutic intervention in order to improve the following deficits and impairments:     Visit Diagnosis: Right shoulder pain, unspecified chronicity  Stiffness of right shoulder, not elsewhere classified  Pain in right elbow  Stiffness of right elbow, not elsewhere classified  Muscle weakness (generalized)     Problem List Patient Active Problem List   Diagnosis Date Noted  . 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 Leukemia, lymphocytic, acute ([place]) 12/21/2012    Clydia Nieves C. Maclain Cohron PT, DPT 08/27/16 10:22 AM   [facility] [place] [facility] [address] [place], [place], [address] Phone: [phone]   Fax:  [phone]  Name: Tyton Abdallah MRN: [id number] Date of Birth: [birth year]

## 2016-08-29 ENCOUNTER — Encounter: Payer: Self-pay | Admitting: Physical Therapy

## 2016-08-29 ENCOUNTER — Ambulatory Visit: Payer: BC Managed Care – PPO | Admitting: Physical Therapy

## 2016-08-29 DIAGNOSIS — M25511 Pain in right shoulder: Secondary | ICD-10-CM | POA: Diagnosis not present

## 2016-08-29 DIAGNOSIS — M6281 Muscle weakness (generalized): Secondary | ICD-10-CM

## 2016-08-29 DIAGNOSIS — M25621 Stiffness of right elbow, not elsewhere classified: Secondary | ICD-10-CM

## 2016-08-29 DIAGNOSIS — M25521 Pain in right elbow: Secondary | ICD-10-CM

## 2016-08-29 DIAGNOSIS — M25611 Stiffness of right shoulder, not elsewhere classified: Secondary | ICD-10-CM

## 2016-08-29 NOTE — Therapy (Signed)
[place], [place], [address] Phone: [phone]   Fax:  [phone]  Physical Therapy Treatment  Patient Details  Name: Maurice Wright MRN: [id number] Date of Birth: 06-20-1995 Referring Provider: Chong Sicilian, MD  Encounter Date: 08/29/2016      PT End of Session - 08/29/16 0957    Visit Number 17   Number of Visits 30   Date for PT Re-Evaluation 09/19/16   PT Start Time 0840   PT Stop Time 0938   PT Time Calculation (min) 58 min   Activity Tolerance Patient tolerated treatment well   Behavior During Therapy [facility] for tasks assessed/performed      Past Medical History:  Diagnosis Date  . Cancer [facility])     History reviewed. No pertinent surgical history.  There were no vitals filed for this visit.      Subjective Assessment - 08/29/16 0840    Subjective "my shoulder was a little sore after the last session"   Currently in Pain? Yes   Pain Score 1    Pain Location Elbow   Pain Orientation Right   Pain Descriptors / Indicators Sore                         [facility] Adult PT Treatment/Exercise - 08/29/16 0953      Elbow Exercises   Other elbow exercises tricep contraction with yellow band 2 x 10   with controlled eccentric lengthening     Shoulder Exercises: Seated   External Rotation Limitations iso press into yellow tband with attempted elbow flexion; hands rested yellow resisted ER     Shoulder Exercises: ROM/Strengthening   Other ROM/Strengthening Exercises UE ranger- circles, flx& row, ER reach, flexion x 10 forward, angled 45 degrees x 10, and lateral reaching x 10     Cryotherapy   Number Minutes Cryotherapy 10 Minutes   Cryotherapy Location Shoulder;Upper arm   Type of Cryotherapy Ice pack  elbow propped on towel and 2# weight on wrist for LLLD     Manual Therapy   Manual therapy comments IASTM along bicep   Joint Mobilization A>P mobs of the elbow   Soft  tissue mobilization scar mobilization, proximal wrist flexors          Trigger Point Dry Needling - 08/29/16 9024    Consent Given? Yes   Education Handout Provided Yes  given prevously   Muscles Treated Upper Body --  biceps brachii, common wrist extensors/ flexors, anteriordel                PT Short Term Goals - 08/25/16 1518      PT SHORT TERM GOAL #1   Title GHJ PROM within 20 deg of L shoulder by 4/20   Time 4   Period Weeks   Status Partially Met     PT SHORT TERM GOAL #2   Title Pt will demo active elbow flexion to 90 deg against gravity, 2+/5 periscapular MMT by 4/20   Time 4   Period Weeks   Status Achieved     PT SHORT TERM GOAL #3   Title Pt able to perform GHJ ROM to shoulder height actively, against gravity by 5/4   Time 6   Period Weeks   Status On-going     PT SHORT TERM GOAL #4   Title Full elbow extension actively by 5/4   Time 6   Period Weeks   Status  On-going     PT SHORT TERM GOAL #5   Title Pt will verbalize increased use of RUE for self care activities by 4/20   Time 4   Period Weeks   Status Partially Met           PT Long Term Goals - 08/25/16 1519      PT LONG TERM GOAL #1   Title Pt will be able to use RUE to draw at and below shoulder height without hiking shoulder by 6/15 in order to begin return to school   Time 12   Period Weeks   Status On-going     PT LONG TERM GOAL #2   Title Pt will demo GHJ AROM against gravity within 15 deg of LUE   Time 12   Period Weeks   Status On-going     PT LONG TERM GOAL #3   Title Pt will be able to carry & manipulate a 3lb weight in his R hand to improve functional carrying & lifting capacity   Time 12   Period Weeks   Status On-going     PT LONG TERM GOAL #4   Title FOTO to 71% ability to indicate significant improvement in functional ability   Time 12   Period Weeks   Status On-going               Plan - 08/29/16 0957    Clinical Impression Statement  reported minimal soreness in the shoulder today. continued TPDN over the biceps brachii, and anterior deltoid, common extensors and wrist flexors. IASTM and mobs with performed which he was able to get to -20 today. continued strenthenig of th eshoulder and elbow extensors in sitting. utilized ice post session with LLLD stretching using weight for bicep stretching.    PT Next Visit Plan assess response to TPDN, manual shoulder strengthneing, tricep strengthening, scapular stabilizers, modalities PRN   PT Home Exercise Plan scar mobility, playdough hand work, scapular retraction, passive supination stretch-pull hand fwd to stretch elbow-active GHJ ext to pull back; table slide (move GHJ)   Consulted and Agree with Plan of Care Patient      Patient will benefit from skilled therapeutic intervention in order to improve the following deficits and impairments:  Decreased range of motion, Increased fascial restricitons, Impaired UE functional use, Increased muscle spasms, Decreased endurance, Decreased activity tolerance, Pain, Improper body mechanics, Impaired flexibility, Hypomobility, Decreased scar mobility, Decreased mobility, Decreased strength, Postural dysfunction  Visit Diagnosis: Right shoulder pain, unspecified chronicity  Stiffness of right shoulder, not elsewhere classified  Pain in right elbow  Stiffness of right elbow, not elsewhere classified  Muscle weakness (generalized)     Problem List Patient Active Problem List   Diagnosis Date Noted  . 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 Leukemia, lymphocytic, acute ([place]) 12/21/2012   Starr Lake PT, DPT, LAT, ATC  08/29/16  10:00 AM      [place] [facility] [address] [place], [place], [address] Phone: [phone]   Fax:  [phone]  Name: [place] Vandergriff MRN: [id number] Date of Birth: Feb 25, 1996

## 2016-09-08 ENCOUNTER — Ambulatory Visit: Payer: BC Managed Care – PPO | Attending: Orthopedic Surgery | Admitting: Physical Therapy

## 2016-09-08 ENCOUNTER — Encounter: Payer: Self-pay | Admitting: Physical Therapy

## 2016-09-08 DIAGNOSIS — M25511 Pain in right shoulder: Secondary | ICD-10-CM | POA: Insufficient documentation

## 2016-09-08 DIAGNOSIS — M25621 Stiffness of right elbow, not elsewhere classified: Secondary | ICD-10-CM | POA: Diagnosis present

## 2016-09-08 DIAGNOSIS — M6281 Muscle weakness (generalized): Secondary | ICD-10-CM | POA: Insufficient documentation

## 2016-09-08 DIAGNOSIS — M25521 Pain in right elbow: Secondary | ICD-10-CM | POA: Diagnosis present

## 2016-09-08 DIAGNOSIS — M25611 Stiffness of right shoulder, not elsewhere classified: Secondary | ICD-10-CM | POA: Insufficient documentation

## 2016-09-08 NOTE — Therapy (Signed)
[place], [place], [address] Phone: [phone]   Fax:  [phone]  Physical Therapy Treatment  Patient Details  Name: Maurice Wright MRN: [id number] Date of Birth: 1995-06-04 Referring Provider: Chong Sicilian, MD  Encounter Date: 09/08/2016      PT End of Session - 09/08/16 1145    Visit Number 18   Number of Visits 30   Date for PT Re-Evaluation 09/19/16   PT Start Time 1101   PT Stop Time 1145   PT Time Calculation (min) 44 min   Activity Tolerance Patient tolerated treatment well   Behavior During Therapy [facility] for tasks assessed/performed      Past Medical History:  Diagnosis Date  . Cancer [facility])     History reviewed. No pertinent surgical history.  There were no vitals filed for this visit.      Subjective Assessment - 09/08/16 1104    Subjective "doing pretty good, didn't do much PT in the hospital" pt reported talking to his MD and reported he didn't need a JAS splint for the elbow.   Currently in Pain? No/denies   Pain Score 0-No pain   Pain Location Elbow   Pain Orientation Right   Pain Frequency Rarely   Aggravating Factors  N/A   Pain Relieving Factors N/A            [facility] PT Assessment - 09/08/16 0001      AROM   Right Elbow Extension -30     PROM   Right Elbow Extension -22                     [facility] Adult PT Treatment/Exercise - 09/08/16 1105      Elbow Exercises   Wrist Flexion Strengthening;Right;Seated;10 reps  Tan therabar   Wrist Extension Strengthening;Right;10 reps;Seated  tan therabar   Other elbow exercises resisted pronation/ supination using therabar 2 x 10 , Contract/ relax with 10 sec hold for bicep stretch.    Other elbow exercises tricep contraction with yellow band 2 x 10      Shoulder Exercises: Seated   External Rotation AAROM;Strengthening;Right;15 reps     Shoulder Exercises: Prone   Other Prone Exercises I's T's Y's 2  x 10 ea.    tactile cues for available ROM, needed assist with "T"     Shoulder Exercises: Sidelying   External Rotation AAROM;Strengthening;Right;10 reps  difficulty with      Shoulder Exercises: ROM/Strengthening   UBE (Upper Arm Bike) L1 x 6   changing direction at 3 min     Manual Therapy   Joint Mobilization A>P mobs for proximal radioulnar joint with graudal elbow extension                  PT Short Term Goals - 08/25/16 1518      PT SHORT TERM GOAL #1   Title GHJ PROM within 20 deg of L shoulder by 4/20   Time 4   Period Weeks   Status Partially Met     PT SHORT TERM GOAL #2   Title Pt will demo active elbow flexion to 90 deg against gravity, 2+/5 periscapular MMT by 4/20   Time 4   Period Weeks   Status Achieved     PT SHORT TERM GOAL #3   Title Pt able to perform GHJ ROM to shoulder height actively, against gravity by 5/4   Time 6   Period Weeks  Status On-going     PT SHORT TERM GOAL #4   Title Full elbow extension actively by 5/4   Time 6   Period Weeks   Status On-going     PT SHORT TERM GOAL #5   Title Pt will verbalize increased use of RUE for self care activities by 4/20   Time 4   Period Weeks   Status Partially Met           PT Long Term Goals - 08/25/16 1519      PT LONG TERM GOAL #1   Title Pt will be able to use RUE to draw at and below shoulder height without hiking shoulder by 6/15 in order to begin return to school   Time 12   Period Weeks   Status On-going     PT LONG TERM GOAL #2   Title Pt will demo GHJ AROM against gravity within 15 deg of LUE   Time 12   Period Weeks   Status On-going     PT LONG TERM GOAL #3   Title Pt will be able to carry & manipulate a 3lb weight in his R hand to improve functional carrying & lifting capacity   Time 12   Period Weeks   Status On-going     PT LONG TERM GOAL #4   Title FOTO to 71% ability to indicate significant improvement in functional ability   Time 12   Period  Weeks   Status On-going               Plan - 09/08/16 1145    Clinical Impression Statement pt demos regression in elbow extension at -30 Active with PROM to -22. pt opted to hold off on TPDN today. continued manual improve elbow mobility. progressed shoulder strength to prone with AAROM  for shoulder strengthening and promote passive elbow extension stretching;  raising table periodically as he progressed.  He declined modalities post session.    PT Next Visit Plan UBE, goals, prone shoulder work, manual shoulder strengthneing, tricep strengthening, scapular stabilizers, modalities PRN   PT Home Exercise Plan scar mobility, playdough hand work, scapular retraction, passive supination stretch-pull hand fwd to stretch elbow-active GHJ ext to pull back; table slide (move GHJ)   Consulted and Agree with Plan of Care Patient      Patient will benefit from skilled therapeutic intervention in order to improve the following deficits and impairments:     Visit Diagnosis: Right shoulder pain, unspecified chronicity  Stiffness of right shoulder, not elsewhere classified  Pain in right elbow  Stiffness of right elbow, not elsewhere classified  Muscle weakness (generalized)     Problem List Patient Active Problem List   Diagnosis Date Noted  . 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 Leukemia, lymphocytic, acute ([place]) 12/21/2012   Starr Lake PT, DPT, LAT, ATC  09/08/16  11:51 AM      [facility] [facility] [address] [place], [place], [address] Phone: [phone]   Fax:  [phone]  Name: Maurice Wright MRN: [id number] Date of Birth: May 16, 1995

## 2016-09-10 ENCOUNTER — Ambulatory Visit: Payer: BC Managed Care – PPO | Admitting: Physical Therapy

## 2016-09-10 ENCOUNTER — Encounter: Payer: Self-pay | Admitting: Physical Therapy

## 2016-09-10 DIAGNOSIS — M6281 Muscle weakness (generalized): Secondary | ICD-10-CM

## 2016-09-10 DIAGNOSIS — M25611 Stiffness of right shoulder, not elsewhere classified: Secondary | ICD-10-CM

## 2016-09-10 DIAGNOSIS — M25521 Pain in right elbow: Secondary | ICD-10-CM

## 2016-09-10 DIAGNOSIS — M25621 Stiffness of right elbow, not elsewhere classified: Secondary | ICD-10-CM

## 2016-09-10 DIAGNOSIS — M25511 Pain in right shoulder: Secondary | ICD-10-CM

## 2016-09-10 NOTE — Therapy (Signed)
[place], [place], [address] Phone: [phone]   Fax:  [phone]  Physical Therapy Treatment  Patient Details  Name: Maurice Wright MRN: [id number] Date of Birth: [birth year] Referring Provider: Chong Sicilian, MD  Encounter Date: 09/10/2016      PT End of Session - 09/10/16 1633    Visit Number 19   Number of Visits 30   Date for PT Re-Evaluation 09/19/16   Authorization Type BCBS- no visit limit   PT Start Time 1632   PT Stop Time 1713   PT Time Calculation (min) 41 min   Activity Tolerance Patient tolerated treatment well   Behavior During Therapy [facility] for tasks assessed/performed      Past Medical History:  Diagnosis Date  . Cancer [facility])     History reviewed. No pertinent surgical history.  There were no vitals filed for this visit.      Subjective Assessment - 09/10/16 1633    Subjective A little sore in shoulder after strengthening at last visit.    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 Yes   Pain Score --  "not too bad"   Pain Location Shoulder   Pain Orientation Right   Pain Descriptors / Indicators Sore                         [facility] Adult PT Treatment/Exercise - 09/10/16 0001      Elbow Exercises   Other elbow exercises pronation/supination 1# weight   Other elbow exercises wrist flexion/ext 1#     Shoulder Exercises: Supine   Other Supine Exercises yellow tband resisted triceps ext- PT applying AP pressure to GHJ   Other Supine Exercises biceps curl, ER/IR- all guarded by PT     Shoulder Exercises: Seated   External Rotation Limitations L pull yellow tband, R iso hold   Other Seated Exercises with ranger: row yellow tband, AROM ER, AA flexion to 25 deg   Other Seated Exercises diagonal reach to table, AAROM from wand     Shoulder Exercises: ROM/Strengthening   UBE (Upper Arm Bike) L1 3'/3'                   PT Short Term Goals - 08/25/16 1518      PT SHORT TERM GOAL #1   Title GHJ PROM within 20 deg of L shoulder by 4/20   Time 4   Period Weeks   Status Partially Met     PT SHORT TERM GOAL #2   Title Pt will demo active elbow flexion to 90 deg against gravity, 2+/5 periscapular MMT by 4/20   Time 4   Period Weeks   Status Achieved     PT SHORT TERM GOAL #3   Title Pt able to perform GHJ ROM to shoulder height actively, against gravity by 5/4   Time 6   Period Weeks   Status On-going     PT SHORT TERM GOAL #4   Title Full elbow extension actively by 5/4   Time 6   Period Weeks   Status On-going     PT SHORT TERM GOAL #5   Title Pt will verbalize increased use of RUE for self care activities by 4/20   Time 4   Period Weeks   Status Partially Met           PT Long Term Goals - 08/25/16 1519  PT LONG TERM GOAL #1   Title Pt will be able to use RUE to draw at and below shoulder height without hiking shoulder by 6/15 in order to begin return to school   Time 12   Period Weeks   Status On-going     PT LONG TERM GOAL #2   Title Pt will demo GHJ AROM against gravity within 15 deg of LUE   Time 12   Period Weeks   Status On-going     PT LONG TERM GOAL #3   Title Pt will be able to carry & manipulate a 3lb weight in his R hand to improve functional carrying & lifting capacity   Time 12   Period Weeks   Status On-going     PT LONG TERM GOAL #4   Title FOTO to 71% ability to indicate significant improvement in functional ability   Time 12   Period Weeks   Status On-going               Plan - 09/10/16 1715    Clinical Impression Statement Pt was sore from last session and demo difficulty with exercises today. Focused on shoulder strength and beginning activation in functional reaching motions in AAROM manner.    PT Treatment/Interventions ADLs/Self Care Home Management;Cryotherapy;Functional mobility training;Therapeutic activities;Therapeutic  exercise;Neuromuscular re-education;Patient/family education;Passive range of motion;Scar mobilization;Manual lymph drainage;Manual techniques;Taping;Dry needling   PT Next Visit Plan UBE, goals, prone shoulder work, manual shoulder strengthneing, tricep strengthening, scapular stabilizers, modalities PRN   PT Home Exercise Plan scar mobility, playdough hand work, scapular retraction, passive supination stretch-pull hand fwd to stretch elbow-active GHJ ext to pull back; table slide (move GHJ); Iso ER hold with L arm pulling yellow band, supine on couch AROM ER and biceps curl   Consulted and Agree with Plan of Care Patient      Patient will benefit from skilled therapeutic intervention in order to improve the following deficits and impairments:  Decreased range of motion, Increased fascial restricitons, Impaired UE functional use, Increased muscle spasms, Decreased endurance, Decreased activity tolerance, Pain, Improper body mechanics, Impaired flexibility, Hypomobility, Decreased scar mobility, Decreased mobility, Decreased strength, Postural dysfunction  Visit Diagnosis: Right shoulder pain, unspecified chronicity  Pain in right elbow  Stiffness of right shoulder, not elsewhere classified  Stiffness of right elbow, not elsewhere classified  Muscle weakness (generalized)     Problem List Patient Active Problem List   Diagnosis Date Noted  . 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 Leukemia, lymphocytic, acute ([place]) 12/21/2012    Thales Knipple C. Airianna Kreischer PT, DPT 09/10/16 5:25 PM   [facility] [address] [place], [place], [address] Phone: [phone]   Fax:  [phone]  Name: Maurice Wright MRN: [id number] Date of Birth: 21-Nov-1995

## 2016-09-12 ENCOUNTER — Encounter: Payer: Self-pay | Admitting: Physical Therapy

## 2016-09-12 ENCOUNTER — Ambulatory Visit: Payer: BC Managed Care – PPO | Admitting: Physical Therapy

## 2016-09-12 DIAGNOSIS — M25521 Pain in right elbow: Secondary | ICD-10-CM

## 2016-09-12 DIAGNOSIS — M25621 Stiffness of right elbow, not elsewhere classified: Secondary | ICD-10-CM

## 2016-09-12 DIAGNOSIS — M25511 Pain in right shoulder: Secondary | ICD-10-CM

## 2016-09-12 DIAGNOSIS — M6281 Muscle weakness (generalized): Secondary | ICD-10-CM

## 2016-09-12 DIAGNOSIS — M25611 Stiffness of right shoulder, not elsewhere classified: Secondary | ICD-10-CM

## 2016-09-12 NOTE — Therapy (Signed)
[place], [place], [address] Phone: [phone]   Fax:  [phone]  Physical Therapy Treatment  Patient Details  Name: Maurice Wright MRN: [id number] Date of Birth: November 25, 1995 Referring Provider: Chong Sicilian, MD  Encounter Date: 09/12/2016      PT End of Session - 09/12/16 1119    Visit Number 20   Number of Visits 30   Date for PT Re-Evaluation 09/19/16   PT Start Time 3007   PT Stop Time 1102   PT Time Calculation (min) 47 min   Activity Tolerance Patient tolerated treatment well   Behavior During Therapy [facility] for tasks assessed/performed      Past Medical History:  Diagnosis Date  . Cancer [facility])     History reviewed. No pertinent surgical history.  There were no vitals filed for this visit.      Subjective Assessment - 09/12/16 1019    Subjective "I was sore alittle around the bottom of my arm"    Currently in Pain? No/denies   Pain Score 0-No pain   Pain Orientation Right   Pain Descriptors / Indicators Sore   Pain Type Surgical pain   Pain Frequency Rarely                         [facility] Adult PT Treatment/Exercise - 09/12/16 1021      Shoulder Exercises: Supine   External Rotation Strengthening;Right;15 reps  with scapular retraction, guarded by PT    Flexion AAROM;Strengthening;Both;12 reps  x 2 sets   Other Supine Exercises ceiling punch (in available ROM) 2 x 15, guarded by PT    Other Supine Exercises biceps curl, Tricep extensoin 2 x 12 ea. all guarded by PT  utilzing yellow theraband     Shoulder Exercises: Prone   Other Prone Exercises I's T's Y's 2 x 12 ea.       Shoulder Exercises: ROM/Strengthening   UBE (Upper Arm Bike) L1 8 min  changing direction at 4 min                  PT Short Term Goals - 08/25/16 1518      PT SHORT TERM GOAL #1   Title GHJ PROM within 20 deg of L shoulder by 4/20   Time 4   Period Weeks   Status  Partially Met     PT SHORT TERM GOAL #2   Title Pt will demo active elbow flexion to 90 deg against gravity, 2+/5 periscapular MMT by 4/20   Time 4   Period Weeks   Status Achieved     PT SHORT TERM GOAL #3   Title Pt able to perform GHJ ROM to shoulder height actively, against gravity by 5/4   Time 6   Period Weeks   Status On-going     PT SHORT TERM GOAL #4   Title Full elbow extension actively by 5/4   Time 6   Period Weeks   Status On-going     PT SHORT TERM GOAL #5   Title Pt will verbalize increased use of RUE for self care activities by 4/20   Time 4   Period Weeks   Status Partially Met           PT Long Term Goals - 08/25/16 1519      PT LONG TERM GOAL #1   Title Pt will be able to use RUE to draw at and  below shoulder height without hiking shoulder by 6/15 in order to begin return to school   Time 12   Period Weeks   Status On-going     PT LONG TERM GOAL #2   Title Pt will demo GHJ AROM against gravity within 15 deg of LUE   Time 12   Period Weeks   Status On-going     PT LONG TERM GOAL #3   Title Pt will be able to carry & manipulate a 3lb weight in his R hand to improve functional carrying & lifting capacity   Time 12   Period Weeks   Status On-going     PT LONG TERM GOAL #4   Title FOTO to 71% ability to indicate significant improvement in functional ability   Time 12   Period Weeks   Status On-going               Plan - 09/12/16 1119    Clinical Impression Statement reported some soreness that wraps around the circumference of the distal humerus/ prothesis, noted popping in the medial elbow which appears to be located around the proximal ulna that can be felt in the distal ulna. Discussed benefit of speaking with MD on his visit on the 18th for further assessment. continued focus of shoulder/ elbow strengthening which he tolerated well requried intermittent guarding to prevent abberrant movement.    PT Next Visit Plan GOALS, UBE, prone  shoulder work, manual shoulder strengthneing, tricep strengthening, scapular stabilizers, modalities PRN   PT Home Exercise Plan scar mobility, playdough hand work, scapular retraction, passive supination stretch-pull hand fwd to stretch elbow-active GHJ ext to pull back; table slide (move GHJ); Iso ER hold with L arm pulling yellow band, supine on couch AROM ER and biceps curl   Consulted and Agree with Plan of Care Patient      Patient will benefit from skilled therapeutic intervention in order to improve the following deficits and impairments:  Decreased range of motion, Increased fascial restricitons, Impaired UE functional use, Increased muscle spasms, Decreased endurance, Decreased activity tolerance, Pain, Improper body mechanics, Impaired flexibility, Hypomobility, Decreased scar mobility, Decreased mobility, Decreased strength, Postural dysfunction  Visit Diagnosis: Right shoulder pain, unspecified chronicity  Pain in right elbow  Stiffness of right shoulder, not elsewhere classified  Stiffness of right elbow, not elsewhere classified  Muscle weakness (generalized)     Problem List Patient Active Problem List   Diagnosis Date Noted  . 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 Leukemia, lymphocytic, acute ([place]) 12/21/2012   Starr Lake PT, DPT, LAT, ATC  09/12/16  11:25 AM      [facility] [address] [place], [place], [address] Phone: [phone]   Fax:  [phone]  Name: Maurice Wright MRN: [id number] Date of Birth: 1995-06-24

## 2016-09-19 ENCOUNTER — Ambulatory Visit: Payer: BC Managed Care – PPO | Admitting: Physical Therapy

## 2016-09-19 ENCOUNTER — Encounter: Payer: Self-pay | Admitting: Physical Therapy

## 2016-09-19 DIAGNOSIS — M25521 Pain in right elbow: Secondary | ICD-10-CM

## 2016-09-19 DIAGNOSIS — M25511 Pain in right shoulder: Secondary | ICD-10-CM | POA: Diagnosis not present

## 2016-09-19 DIAGNOSIS — M6281 Muscle weakness (generalized): Secondary | ICD-10-CM

## 2016-09-19 DIAGNOSIS — M25611 Stiffness of right shoulder, not elsewhere classified: Secondary | ICD-10-CM

## 2016-09-19 DIAGNOSIS — M25621 Stiffness of right elbow, not elsewhere classified: Secondary | ICD-10-CM

## 2016-09-19 NOTE — Therapy (Signed)
[place], [place], [address] Phone: [phone]   Fax:  [phone]  Physical Therapy Treatment  Patient Details  Name: Maurice Wright MRN: [id number] Date of Birth: 1995-05-21 Referring Provider: Chong Sicilian, MD  Encounter Date: 09/19/2016      PT End of Session - 09/19/16 0930    Visit Number 21   Number of Visits 30   Date for PT Re-Evaluation 11/16/16   Authorization Type BCBS- no visit limit   PT Start Time 0931   PT Stop Time 1009   PT Time Calculation (min) 38 min   Activity Tolerance Patient tolerated treatment well   Behavior During Therapy [facility] for tasks assessed/performed      Past Medical History:  Diagnosis Date  . Cancer [facility])     History reviewed. No pertinent surgical history.  There were no vitals filed for this visit.      Subjective Assessment - 09/19/16 0932    Subjective Reports arm feels okay. A little surprised about lack of GHJ motion, seeing surgeon on Monday.    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 No/denies            [facility] PT Assessment - 09/19/16 0001      Assessment   Medical Diagnosis R humerus replacement   Referring Provider Chong Sicilian, MD   Onset Date/Surgical Date 05/09/16     Observation/Other Assessments   Focus on Therapeutic Outcomes (FOTO)  56% limited     Sensation   Additional Comments WFL     AROM   Right Shoulder Extension 45 Degrees   Right Shoulder Flexion 26 Degrees   Right Shoulder ABduction 32 Degrees   Right Shoulder Internal Rotation --  to S1 height, to R of midline   Right Shoulder External Rotation 10 Degrees  in standing, no support to biceps     PROM   Right Shoulder Flexion 90 Degrees   Right Shoulder ABduction 90 Degrees   Right Shoulder Internal Rotation 90 Degrees   Right Shoulder External Rotation 90 Degrees   Right Elbow Extension -22     Strength   Right Hand  Grip (lbs) 47lb                      [facility] Adult PT Treatment/Exercise - 09/19/16 0001      Shoulder Exercises: Supine   External Rotation Limitations attempted AROM, holding band for guarding from other arm   Flexion 12 reps   Flexion Limitations chest press with wand, AAROM   Other Supine Exercises supine biceps curl, mid range; neutral & supination     Shoulder Exercises: Seated   Other Seated Exercises towel slides ABCs     Shoulder Exercises: Isometric Strengthening   Extension Other (comment)  10x5s   Extension Limitations holding wand in elbow flexion, supine, press into towel- elbow & shoulder                PT Education - 09/19/16 1013    Education provided Yes   Education Details discussion of goals, progress & POC. exercise form/rationale. archery assistive device   Person(s) Educated Patient   Methods Explanation;Demonstration;Tactile cues;Verbal cues   Comprehension Verbalized understanding;Returned demonstration;Verbal cues required;Tactile cues required;Need further instruction          PT Short Term Goals - 08/25/16 1518      PT SHORT TERM GOAL #1   Title GHJ  PROM within 20 deg of L shoulder by 4/20   Time 4   Period Weeks   Status Partially Met     PT SHORT TERM GOAL #2   Title Pt will demo active elbow flexion to 90 deg against gravity, 2+/5 periscapular MMT by 4/20   Time 4   Period Weeks   Status Achieved     PT SHORT TERM GOAL #3   Title Pt able to perform GHJ ROM to shoulder height actively, against gravity by 5/4   Time 6   Period Weeks   Status On-going     PT SHORT TERM GOAL #4   Title Full elbow extension actively by 5/4   Time 6   Period Weeks   Status On-going     PT SHORT TERM GOAL #5   Title Pt will verbalize increased use of RUE for self care activities by 4/20   Time 4   Period Weeks   Status Partially Met           PT Long Term Goals - 09/19/16 1003      PT LONG TERM GOAL #1   Title Pt will  be able to use RUE to draw at and below shoulder height without hiking shoulder by 6/15 in order to begin return to school   Baseline is using arm but is still limited in ability move laterally   Time 8   Period Weeks   Status On-going     PT LONG TERM GOAL #2   Title Pt will demo GHJ AROM against gravity within 15 deg of LUE   Baseline see flowsheet   Time 8   Period Weeks   Status On-going     PT LONG TERM GOAL #3   Title Pt will be able to carry & manipulate a 3lb weight in his R hand to improve functional carrying & lifting capacity   Baseline limited to 1lb lifting due to instability at shoulder at this point   Status On-going     PT LONG TERM GOAL #4   Title FOTO to 71% ability to indicate significant improvement in functional ability   Baseline 56% limited on 6/15   Time 8   Period Weeks   Status On-going               Plan - 09/19/16 1000    Clinical Impression Statement Pt demo improved AROM in abd and has not lost motion in other ranges indicating strength has sustained through healing and will benefit from furhter strengthening to improve movment and stability. Pt does not feel limited by elbow flexibility at this time and we agree that best course of care would be to focus on functional movement through shoulder. Pt is seeing MD on Monday and I asked that he request education on functional outcome expectations from surgeons. Will continue at [place] 3/week until school begins around admissions for chemo.    PT Frequency 3x / week   PT Duration 8 weeks   PT Treatment/Interventions ADLs/Self Care Home Management;Cryotherapy;Functional mobility training;Therapeutic activities;Therapeutic exercise;Neuromuscular re-education;Patient/family education;Passive range of motion;Scar mobilization;Manual lymph drainage;Manual techniques;Taping;Dry needling   PT Home Exercise Plan scar mobility, playdough hand work, scapular retraction, passive supination stretch-pull hand fwd to  stretch elbow-active GHJ ext to pull back; table slide (move GHJ); Iso ER hold with L arm pulling yellow band, supine on couch AROM ER and biceps curl   Consulted and Agree with Plan of Care Patient  Patient will benefit from skilled therapeutic intervention in order to improve the following deficits and impairments:  Decreased range of motion, Increased fascial restricitons, Impaired UE functional use, Increased muscle spasms, Decreased endurance, Decreased activity tolerance, Pain, Improper body mechanics, Impaired flexibility, Hypomobility, Decreased scar mobility, Decreased mobility, Decreased strength, Postural dysfunction  Visit Diagnosis: Right shoulder pain, unspecified chronicity - Plan: PT plan of care cert/re-cert  Pain in right elbow - Plan: PT plan of care cert/re-cert  Stiffness of right shoulder, not elsewhere classified - Plan: PT plan of care cert/re-cert  Stiffness of right elbow, not elsewhere classified - Plan: PT plan of care cert/re-cert  Muscle weakness (generalized) - Plan: PT plan of care cert/re-cert     Problem List Patient Active Problem List   Diagnosis Date Noted  . 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 Leukemia, lymphocytic, acute ([place]) 12/21/2012    Tija Biss C. Malakhai Beitler PT, DPT 09/19/16 10:16 AM   [place] [facility] [address] [place], [place], [address] Phone: [phone]   Fax:  [phone]  Name: Maurice Wright MRN: [id number] Date of Birth: 03/10/1996

## 2016-09-22 ENCOUNTER — Ambulatory Visit: Payer: BC Managed Care – PPO | Admitting: Physical Therapy

## 2016-09-22 ENCOUNTER — Encounter: Payer: Self-pay | Admitting: Physical Therapy

## 2016-09-22 DIAGNOSIS — M25511 Pain in right shoulder: Secondary | ICD-10-CM | POA: Diagnosis not present

## 2016-09-22 DIAGNOSIS — M25611 Stiffness of right shoulder, not elsewhere classified: Secondary | ICD-10-CM

## 2016-09-22 DIAGNOSIS — M6281 Muscle weakness (generalized): Secondary | ICD-10-CM

## 2016-09-22 DIAGNOSIS — M25521 Pain in right elbow: Secondary | ICD-10-CM

## 2016-09-22 DIAGNOSIS — M25621 Stiffness of right elbow, not elsewhere classified: Secondary | ICD-10-CM

## 2016-09-22 NOTE — Therapy (Signed)
[place], [place], [address] Phone: [phone]   Fax:  [phone]  Physical Therapy Treatment  Patient Details  Name: Maurice Wright MRN: [id number] Date of Birth: 1995/07/27 Referring Provider: Chong Sicilian, MD  Encounter Date: 09/22/2016      PT End of Session - 09/22/16 0929    Visit Number 22   Number of Visits 30   Date for PT Re-Evaluation 11/16/16   Authorization Type [employer]- no visit limit   PT Start Time 0930   PT Stop Time 1009   PT Time Calculation (min) 39 min   Activity Tolerance Patient tolerated treatment well   Behavior During Therapy [facility] for tasks assessed/performed      Past Medical History:  Diagnosis Date  . Cancer [facility])     History reviewed. No pertinent surgical history.  There were no vitals filed for this visit.      Subjective Assessment - 09/22/16 0930    Subjective A little bit of muscle soreness after last visit in shoulder, denies any pain today.    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 No/denies                         [facility] Adult PT Treatment/Exercise - 09/22/16 0001      Elbow Exercises   Other elbow exercises tan therabar pronation/supination   Other elbow exercises digi flex green     Shoulder Exercises: Supine   Internal Rotation Limitations AROM IR/ER guarded by PT   Flexion Limitations chest press with wand 2x10   Other Supine Exercises supine biceps curl, mid range; neutral & supination     Shoulder Exercises: Seated   Other Seated Exercises ranger ABCs 1# wrist weight   Other Seated Exercises scaption towel slides     Shoulder Exercises: Prone   Retraction 15 reps  5s holds   Other Prone Exercises triceps kicks   Other Prone Exercises IYT x10 each     Shoulder Exercises: ROM/Strengthening   UBE (Upper Arm Bike) L1 3 min fwd     Manual Therapy   Soft tissue mobilization scar  mobilization, supinator muscle                  PT Short Term Goals - 08/25/16 1518      PT SHORT TERM GOAL #1   Title GHJ PROM within 20 deg of L shoulder by 4/20   Time 4   Period Weeks   Status Partially Met     PT SHORT TERM GOAL #2   Title Pt will demo active elbow flexion to 90 deg against gravity, 2+/5 periscapular MMT by 4/20   Time 4   Period Weeks   Status Achieved     PT SHORT TERM GOAL #3   Title Pt able to perform GHJ ROM to shoulder height actively, against gravity by 5/4   Time 6   Period Weeks   Status On-going     PT SHORT TERM GOAL #4   Title Full elbow extension actively by 5/4   Time 6   Period Weeks   Status On-going     PT SHORT TERM GOAL #5   Title Pt will verbalize increased use of RUE for self care activities by 4/20   Time 4   Period Weeks   Status Partially Met           PT  Long Term Goals - 09/19/16 1003      PT LONG TERM GOAL #1   Title Pt will be able to use RUE to draw at and below shoulder height without hiking shoulder by 6/15 in order to begin return to school   Baseline is using arm but is still limited in ability move laterally   Time 8   Period Weeks   Status On-going     PT LONG TERM GOAL #2   Title Pt will demo GHJ AROM against gravity within 15 deg of LUE   Baseline see flowsheet   Time 8   Period Weeks   Status On-going     PT LONG TERM GOAL #3   Title Pt will be able to carry & manipulate a 3lb weight in his R hand to improve functional carrying & lifting capacity   Baseline limited to 1lb lifting due to instability at shoulder at this point   Status On-going     PT LONG TERM GOAL #4   Title FOTO to 71% ability to indicate significant improvement in functional ability   Baseline 56% limited on 6/15   Time 8   Period Weeks   Status On-going               Plan - 09/22/16 1010    Clinical Impression Statement Pt shoulder noted to fatigue quickly with prone exercises today. Some shoulder  discomfort noted in attempt to perform isometric hold of shoulder below 90 deg flexion in supine.    PT Treatment/Interventions ADLs/Self Care Home Management;Cryotherapy;Functional mobility training;Therapeutic activities;Therapeutic exercise;Neuromuscular re-education;Patient/family education;Passive range of motion;Scar mobilization;Manual lymph drainage;Manual techniques;Taping;Dry needling   PT Next Visit Plan GHJ stability, how was visit with surgeon?   PT Home Exercise Plan scar mobility, playdough hand work, scapular retraction, passive supination stretch-pull hand fwd to stretch elbow-active GHJ ext to pull back; table slide (move GHJ); Iso ER hold with L arm pulling yellow band, supine on couch AROM ER and biceps curl   Consulted and Agree with Plan of Care Patient      Patient will benefit from skilled therapeutic intervention in order to improve the following deficits and impairments:  Decreased range of motion, Increased fascial restricitons, Impaired UE functional use, Increased muscle spasms, Decreased endurance, Decreased activity tolerance, Pain, Improper body mechanics, Impaired flexibility, Hypomobility, Decreased scar mobility, Decreased mobility, Decreased strength, Postural dysfunction  Visit Diagnosis: Right shoulder pain, unspecified chronicity  Pain in right elbow  Stiffness of right shoulder, not elsewhere classified  Stiffness of right elbow, not elsewhere classified  Muscle weakness (generalized)     Problem List Patient Active Problem List   Diagnosis Date Noted  . 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 Leukemia, lymphocytic, acute ([place]) 12/21/2012    Maurice Wright PT, DPT 09/22/16 10:12 AM   [place] [facility] [address] [place], [place], [address] Phone: [phone]   Fax:  [phone]  Name: Maurice Wright MRN: [id number] Date of Birth: Sep 06, 1995

## 2016-09-24 ENCOUNTER — Ambulatory Visit: Payer: BC Managed Care – PPO | Admitting: Physical Therapy

## 2016-09-24 ENCOUNTER — Encounter: Payer: Self-pay | Admitting: Physical Therapy

## 2016-09-24 DIAGNOSIS — M25511 Pain in right shoulder: Secondary | ICD-10-CM

## 2016-09-24 DIAGNOSIS — M25621 Stiffness of right elbow, not elsewhere classified: Secondary | ICD-10-CM

## 2016-09-24 DIAGNOSIS — M6281 Muscle weakness (generalized): Secondary | ICD-10-CM

## 2016-09-24 DIAGNOSIS — M25521 Pain in right elbow: Secondary | ICD-10-CM

## 2016-09-24 DIAGNOSIS — M25611 Stiffness of right shoulder, not elsewhere classified: Secondary | ICD-10-CM

## 2016-09-24 NOTE — Therapy (Signed)
[place], [place], [address] Phone: [phone]   Fax:  [phone]  Physical Therapy Treatment  Patient Details  Name: Maurice Wright MRN: [id number] Date of Birth: [birth year] Referring Provider: Chong Sicilian, MD  Encounter Date: 09/24/2016      PT End of Session - 09/24/16 1021    Visit Number 23   Number of Visits 30   Date for PT Re-Evaluation 11/16/16   PT Start Time 0935   PT Stop Time 1024   PT Time Calculation (min) 49 min   Activity Tolerance Patient tolerated treatment well   Behavior During Therapy [facility] for tasks assessed/performed      Past Medical History:  Diagnosis Date  . Cancer [facility])     History reviewed. No pertinent surgical history.  There were no vitals filed for this visit.      Subjective Assessment - 09/24/16 0940    Subjective "I saw the MD and reported that there is some degree of movement/ clicking which is normal, and that they plan to send a note stating their expectations regarding physical therapy"    Currently in Pain? No/denies   Pain Location Shoulder   Pain Orientation Right   Aggravating Factors  N/A   Pain Relieving Factors N/A                         [facility] Adult PT Treatment/Exercise - 09/24/16 0941      Shoulder Exercises: Sidelying   Flexion Right;AAROM;15 reps  2 sets with UE Range made it to 90 degrees, PT SBA    Other Sidelying Exercises protraction 2 x 10 using UE rangers  ea direction using clock directions "1, 3 and 5" directions    Other Sidelying Exercises Rows 2 x 15 using UE ranger with yellow theraband.     Shoulder Exercises: ROM/Strengthening   UBE (Upper Arm Bike) L1 x 6 min  changing direction at 3 min     Cryotherapy   Number Minutes Cryotherapy 10 Minutes   Cryotherapy Location Shoulder   Type of Cryotherapy Ice pack                  PT Short Term Goals - 08/25/16 1518      PT SHORT TERM  GOAL #1   Title GHJ PROM within 20 deg of L shoulder by 4/20   Time 4   Period Weeks   Status Partially Met     PT SHORT TERM GOAL #2   Title Pt will demo active elbow flexion to 90 deg against gravity, 2+/5 periscapular MMT by 4/20   Time 4   Period Weeks   Status Achieved     PT SHORT TERM GOAL #3   Title Pt able to perform GHJ ROM to shoulder height actively, against gravity by 5/4   Time 6   Period Weeks   Status On-going     PT SHORT TERM GOAL #4   Title Full elbow extension actively by 5/4   Time 6   Period Weeks   Status On-going     PT SHORT TERM GOAL #5   Title Pt will verbalize increased use of RUE for self care activities by 4/20   Time 4   Period Weeks   Status Partially Met           PT Long Term Goals - 09/19/16 1003      PT LONG TERM GOAL #  1   Title Pt will be able to use RUE to draw at and below shoulder height without hiking shoulder by 6/15 in order to begin return to school   Baseline is using arm but is still limited in ability move laterally   Time 8   Period Weeks   Status On-going     PT LONG TERM GOAL #2   Title Pt will demo GHJ AROM against gravity within 15 deg of LUE   Baseline see flowsheet   Time 8   Period Weeks   Status On-going     PT LONG TERM GOAL #3   Title Pt will be able to carry & manipulate a 3lb weight in his R hand to improve functional carrying & lifting capacity   Baseline limited to 1lb lifting due to instability at shoulder at this point   Status On-going     PT LONG TERM GOAL #4   Title FOTO to 71% ability to indicate significant improvement in functional ability   Baseline 56% limited on 6/15   Time 8   Period Weeks   Status On-going               Plan - 09/24/16 1022    Clinical Impression Statement pt reported seeing his Md since last visit and that he is doing well and that they will send expectations regarding his mobility. Focused todays session on GHJ strengthening in L sidelying using UE  ranger. He performed exercises well and is demonstrating improvement in control but still fatigues quickly with exercise. Iced post session for muscle soreness.    PT Next Visit Plan GHJ strengthening/ scapular stability. ice PRN   PT Home Exercise Plan scar mobility, playdough hand work, scapular retraction, passive supination stretch-pull hand fwd to stretch elbow-active GHJ ext to pull back; table slide (move GHJ); Iso ER hold with L arm pulling yellow band, supine on couch AROM ER and biceps curl   Consulted and Agree with Plan of Care Patient      Patient will benefit from skilled therapeutic intervention in order to improve the following deficits and impairments:     Visit Diagnosis: Right shoulder pain, unspecified chronicity  Pain in right elbow  Stiffness of right shoulder, not elsewhere classified  Stiffness of right elbow, not elsewhere classified  Muscle weakness (generalized)     Problem List Patient Active Problem List   Diagnosis Date Noted  . 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 Leukemia, lymphocytic, acute ([place]) 12/21/2012   Starr Lake PT, DPT, LAT, ATC  09/24/16  10:28 AM      [facility] [address] [place], [place], [address] Phone: [phone]   Fax:  [phone]  Name: Maurice Wright MRN: [id number] Date of Birth: 05-26-1995

## 2016-09-26 ENCOUNTER — Ambulatory Visit: Payer: BC Managed Care – PPO | Admitting: Physical Therapy

## 2016-09-26 ENCOUNTER — Encounter: Payer: Self-pay | Admitting: Physical Therapy

## 2016-09-26 DIAGNOSIS — M25511 Pain in right shoulder: Secondary | ICD-10-CM | POA: Diagnosis not present

## 2016-09-26 DIAGNOSIS — M6281 Muscle weakness (generalized): Secondary | ICD-10-CM

## 2016-09-26 DIAGNOSIS — M25521 Pain in right elbow: Secondary | ICD-10-CM

## 2016-09-26 DIAGNOSIS — M25611 Stiffness of right shoulder, not elsewhere classified: Secondary | ICD-10-CM

## 2016-09-26 DIAGNOSIS — M25621 Stiffness of right elbow, not elsewhere classified: Secondary | ICD-10-CM

## 2016-09-26 NOTE — Therapy (Signed)
[facility] [address] [place], [place], [address] Phone: [phone]   Fax:  [phone]  Physical Therapy Treatment  Patient Details  Name: Maurice Wright MRN: [id number] Date of Birth: Feb 14, 1996 Referring Provider: Kayleen Memos, MD  Encounter Date: 09/26/2016      PT End of Session - 09/26/16 1004    Visit Number 24   Number of Visits 30   Date for PT Re-Evaluation 11/16/16   PT Start Time 0922   PT Stop Time 1008   PT Time Calculation (min) 46 min   Activity Tolerance Patient tolerated treatment well   Behavior During Therapy [facility] for tasks assessed/performed      Past Medical History:  Diagnosis Date  . Cancer [facility])     History reviewed. No pertinent surgical history.  There were no vitals filed for this visit.      Subjective Assessment - 09/26/16 0923    Subjective "I wasn't too sore after last session"   Currently in Pain? No/denies   Pain Score 0-No pain   Pain Location Shoulder   Pain Orientation Right   Pain Descriptors / Indicators Sore   Pain Type Surgical pain                         [facility] Adult PT Treatment/Exercise - 09/26/16 0928      Elbow Exercises   Other elbow exercises bicep curl 2 sets to fatgiue with 1#, 2 sets to fatigue with 2#   Other elbow exercises Tricep press 2 sets to fatigue, 1 set yellow band, 1 set red band     Shoulder Exercises: Seated   External Rotation AROM;Strengthening;Right  going to fatgiue 3 sets   External Rotation Limitations tactile cues for equal reps and proper form.      Shoulder Exercises: Standing   Other Standing Exercises rolling orange weighted ball on waist height table forward/ backward 1 x 15, add/ abduction 3 x 10 (each set gradually working away from the body)     Shoulder Exercises: ROM/Strengthening   UBE (Upper Arm Bike) L3 x 6 min forward only   Other ROM/Strengthening Exercises wall ladder 5 x with eccentric  lowering                  PT Short Term Goals - 08/25/16 1518      PT SHORT TERM GOAL #1   Title GHJ PROM within 20 deg of L shoulder by 4/20   Time 4   Period Weeks   Status Partially Met     PT SHORT TERM GOAL #2   Title Pt will demo active elbow flexion to 90 deg against gravity, 2+/5 periscapular MMT by 4/20   Time 4   Period Weeks   Status Achieved     PT SHORT TERM GOAL #3   Title Pt able to perform GHJ ROM to shoulder height actively, against gravity by 5/4   Time 6   Period Weeks   Status On-going     PT SHORT TERM GOAL #4   Title Full elbow extension actively by 5/4   Time 6   Period Weeks   Status On-going     PT SHORT TERM GOAL #5   Title Pt will verbalize increased use of RUE for self care activities by 4/20   Time 4   Period Weeks   Status Partially Met           PT  Long Term Goals - 09/19/16 1003      PT LONG TERM GOAL #1   Title Pt will be able to use RUE to draw at and below shoulder height without hiking shoulder by 6/15 in order to begin return to school   Baseline is using arm but is still limited in ability move laterally   Time 8   Period Weeks   Status On-going     PT LONG TERM GOAL #2   Title Pt will demo GHJ AROM against gravity within 15 deg of LUE   Baseline see flowsheet   Time 8   Period Weeks   Status On-going     PT LONG TERM GOAL #3   Title Pt will be able to carry & manipulate a 3lb weight in his R hand to improve functional carrying & lifting capacity   Baseline limited to 1lb lifting due to instability at shoulder at this point   Status On-going     PT LONG TERM GOAL #4   Title FOTO to 71% ability to indicate significant improvement in functional ability   Baseline 56% limited on 6/15   Time 8   Period Weeks   Status On-going               Plan - 09/26/16 1004    Clinical Impression Statement continued focus on shoulder and elbow strengthening with increased emphasis on endurance training  working into fatigue which he performed well. continued intermittent SBA to prevent abberrant RUE movement but he is demonstrating improvement in control except with fatigue. pt declined modalities today.    PT Next Visit Plan GHJ strengthening/ scapular stability. elbow strength ice PRN   PT Home Exercise Plan scar mobility, playdough hand work, scapular retraction, passive supination stretch-pull hand fwd to stretch elbow-active GHJ ext to pull back; table slide (move GHJ); Iso ER hold with L arm pulling yellow band, supine on couch AROM ER and biceps curl   Consulted and Agree with Plan of Care Patient      Patient will benefit from skilled therapeutic intervention in order to improve the following deficits and impairments:  Decreased range of motion, Increased fascial restricitons, Impaired UE functional use, Increased muscle spasms, Decreased endurance, Decreased activity tolerance, Pain, Improper body mechanics, Impaired flexibility, Hypomobility, Decreased scar mobility, Decreased mobility, Decreased strength, Postural dysfunction  Visit Diagnosis: Right shoulder pain, unspecified chronicity  Pain in right elbow  Stiffness of right elbow, not elsewhere classified  Muscle weakness (generalized)  Stiffness of right shoulder, not elsewhere classified     Problem List Patient Active Problem List   Diagnosis Date Noted  . 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 Leukemia, lymphocytic, acute ([place]) 12/21/2012   Starr Lake PT, DPT, LAT, ATC  09/26/16  10:10 AM      [facility] [address] [place], [place], [address] Phone: [phone]   Fax:  [phone]  Name: Darrelle Wiberg MRN: [id number] Date of Birth: 09/17/1995

## 2016-09-29 ENCOUNTER — Encounter: Payer: BC Managed Care – PPO | Admitting: Physical Therapy

## 2016-10-01 ENCOUNTER — Encounter: Payer: BC Managed Care – PPO | Admitting: Physical Therapy

## 2016-10-03 ENCOUNTER — Encounter: Payer: BC Managed Care – PPO | Admitting: Physical Therapy

## 2016-10-06 ENCOUNTER — Encounter: Payer: Self-pay | Admitting: Physical Therapy

## 2016-10-06 ENCOUNTER — Ambulatory Visit: Payer: BC Managed Care – PPO | Attending: Orthopedic Surgery | Admitting: Physical Therapy

## 2016-10-06 DIAGNOSIS — M25511 Pain in right shoulder: Secondary | ICD-10-CM | POA: Insufficient documentation

## 2016-10-06 DIAGNOSIS — M25611 Stiffness of right shoulder, not elsewhere classified: Secondary | ICD-10-CM | POA: Diagnosis present

## 2016-10-06 DIAGNOSIS — M25521 Pain in right elbow: Secondary | ICD-10-CM | POA: Insufficient documentation

## 2016-10-06 DIAGNOSIS — M6281 Muscle weakness (generalized): Secondary | ICD-10-CM | POA: Insufficient documentation

## 2016-10-06 DIAGNOSIS — M25621 Stiffness of right elbow, not elsewhere classified: Secondary | ICD-10-CM

## 2016-10-06 NOTE — Therapy (Signed)
[place], [place], [address] Phone: [phone]   Fax:  [phone]  Physical Therapy Treatment  Patient Details  Name: Maurice Wright MRN: [id number] Date of Birth: 07/17/1995 Referring Provider: Chong Sicilian, MD  Encounter Date: 10/06/2016      PT End of Session - 10/06/16 1014    Visit Number 25   Number of Visits 30   Date for PT Re-Evaluation 11/16/16   PT Start Time 0931   PT Stop Time 1014   PT Time Calculation (min) 43 min   Activity Tolerance Patient tolerated treatment well   Behavior During Therapy [name]      Past Medical History:  Diagnosis Date  . Cancer [facility])     History reviewed. No pertinent surgical history.  There were no vitals filed for this visit.      Subjective Assessment - 10/06/16 0928    Subjective "I got alittle tight while I was in the Hospital but it has calmed down some"    Currently in Pain? No/denies   Pain Score 0-No pain   Aggravating Factors  N/A   Pain Relieving Factors N/A            [facility] PT Assessment - 10/06/16 0939      AROM   Right Shoulder Extension 32 Degrees  hiking shoulder during motion   Right Shoulder Flexion 25 Degrees   Right Shoulder ABduction 26 Degrees   Right Shoulder External Rotation 10 Degrees                     [facility] Adult PT Treatment/Exercise - 10/06/16 0934      Elbow Exercises   Other elbow exercises prone bicep curl 2 x to fagitue      Shoulder Exercises: Prone   Horizontal ABduction 2 10 reps;Right  adduction with yellow band    Other Prone Exercises row 2 x going to fatigue 1#, row with tricep kickback 2 x going to fatigue 2 x 8    Other Prone Exercises IYT 2 x 12 each     Shoulder Exercises: ROM/Strengthening   UBE (Upper Arm Bike) L3 x 6 min forward only                  PT Short Term Goals - 10/06/16 1010      PT SHORT TERM GOAL #1   Title  GHJ PROM within 20 deg of L shoulder by 4/20   Time 4   Period Weeks   Status On-going     PT SHORT TERM GOAL #2   Title Pt will demo active elbow flexion to 90 deg against gravity, 2+/5 periscapular MMT by 4/20   Time 4   Period Weeks   Status Achieved     PT SHORT TERM GOAL #3   Title Pt able to perform GHJ ROM to shoulder height actively, against gravity by 5/4   Time 6   Period Weeks   Status On-going     PT SHORT TERM GOAL #4   Title Full elbow extension actively by 5/4   Time 6   Period Weeks   Status On-going     PT SHORT TERM GOAL #5   Title Pt will verbalize increased use of RUE for self care activities by 4/20   Time 4   Period Weeks   Status Partially Met           PT Long  Term Goals - 09/19/16 1003      PT LONG TERM GOAL #1   Title Pt will be able to use RUE to draw at and below shoulder height without hiking shoulder by 6/15 in order to begin return to school   Baseline is using arm but is still limited in ability move laterally   Time 8   Period Weeks   Status On-going     PT LONG TERM GOAL #2   Title Pt will demo GHJ AROM against gravity within 15 deg of LUE   Baseline see flowsheet   Time 8   Period Weeks   Status On-going     PT LONG TERM GOAL #3   Title Pt will be able to carry & manipulate a 3lb weight in his R hand to improve functional carrying & lifting capacity   Baseline limited to 1lb lifting due to instability at shoulder at this point   Status On-going     PT LONG TERM GOAL #4   Title FOTO to 71% ability to indicate significant improvement in functional ability   Baseline 56% limited on 6/15   Time 8   Period Weeks   Status On-going               Plan - 10/06/16 1009    Clinical Impression Statement pt continues to report no pain, some lack of ROM compared to previous measures which pt attributes to amount of fluid that is pushed while in inpatient treatment. continued shoulder strengthening focused on prone exercises  for shoulder and elbow strengthening which he performed well.    PT Next Visit Plan GHJ strengthening/ scapular stability. elbow strength ice PRN   PT Home Exercise Plan scar mobility, playdough hand work, scapular retraction, passive supination stretch-pull hand fwd to stretch elbow-active GHJ ext to pull back; table slide (move GHJ); Iso ER hold with L arm pulling yellow band, supine on couch AROM ER and biceps curl      Patient will benefit from skilled therapeutic intervention in order to improve the following deficits and impairments:  Decreased range of motion, Increased fascial restricitons, Impaired UE functional use, Increased muscle spasms, Decreased endurance, Decreased activity tolerance, Pain, Improper body mechanics, Impaired flexibility, Hypomobility, Decreased scar mobility, Decreased mobility, Decreased strength, Postural dysfunction  Visit Diagnosis: Right shoulder pain, unspecified chronicity  Pain in right elbow  Stiffness of right elbow, not elsewhere classified  Muscle weakness (generalized)  Stiffness of right shoulder, not elsewhere classified     Problem List Patient Active Problem List   Diagnosis Date Noted  . 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 Leukemia, lymphocytic, acute ([place]) 12/21/2012   Starr Lake PT, DPT, LAT, ATC  10/06/16  10:15 AM      [facility] [address] [place], [place], [address] Phone: [phone]   Fax:  [phone]  Name: Maurice Wright MRN: [id number] Date of Birth: 07/07/1995

## 2016-10-10 ENCOUNTER — Ambulatory Visit: Payer: BC Managed Care – PPO | Admitting: Physical Therapy

## 2016-10-10 ENCOUNTER — Encounter: Payer: Self-pay | Admitting: Physical Therapy

## 2016-10-10 DIAGNOSIS — M25511 Pain in right shoulder: Secondary | ICD-10-CM | POA: Diagnosis not present

## 2016-10-10 DIAGNOSIS — M25621 Stiffness of right elbow, not elsewhere classified: Secondary | ICD-10-CM

## 2016-10-10 DIAGNOSIS — M25521 Pain in right elbow: Secondary | ICD-10-CM

## 2016-10-10 DIAGNOSIS — M6281 Muscle weakness (generalized): Secondary | ICD-10-CM

## 2016-10-10 DIAGNOSIS — M25611 Stiffness of right shoulder, not elsewhere classified: Secondary | ICD-10-CM

## 2016-10-10 NOTE — Therapy (Signed)
[place], [place], [address] Phone: [phone]   Fax:  [phone]  Physical Therapy Treatment  Patient Details  Name: Maurice Wright MRN: [id number] Date of Birth: Apr 13, 1995 Referring Provider: Chong Sicilian, MD  Encounter Date: 10/10/2016      PT End of Session - 10/10/16 1018    Visit Number 26   Number of Visits 30   Date for PT Re-Evaluation 11/16/16   Authorization Type BCBS- no visit limit   PT Start Time 1018   PT Stop Time 1055   PT Time Calculation (min) 37 min   Activity Tolerance Patient tolerated treatment well   Behavior During Therapy [facility] for tasks assessed/performed      Past Medical History:  Diagnosis Date  . Cancer [facility])     History reviewed. No pertinent surgical history.  There were no vitals filed for this visit.      Subjective Assessment - 10/10/16 1018    Subjective Pt reports being a little sore today after fishing yesterday.    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 No/denies                         [facility] Adult PT Treatment/Exercise - 10/10/16 0001      Shoulder Exercises: Supine   External Rotation 20 reps   Theraband Level (Shoulder External Rotation) Level 1 (Yellow)   External Rotation Limitations arm propped on pillow   Other Supine Exercises ER pull on yellow tband paired with elbow flexion     Shoulder Exercises: Seated   Extension Limitations resisted triceps kicks yellow tband   External Rotation 15 reps   External Rotation Limitations arm to side on elevated bolster; seated cup moves from knee ot table laterally   Other Seated Exercises seated towel slides on table-flx & scaption   Other Seated Exercises biceps curls     Shoulder Exercises: Sidelying   Flexion AAROM  on ranger   Other Sidelying Exercises elbow flexion/ext on ranger     Shoulder Exercises: ROM/Strengthening   UBE (Upper Arm  Bike) 2 min fwd, 2 min back L1     Manual Therapy   Soft tissue mobilization R deltois & brachioraidalis                  PT Short Term Goals - 10/06/16 1010      PT SHORT TERM GOAL #1   Title GHJ PROM within 20 deg of L shoulder by 4/20   Time 4   Period Weeks   Status On-going     PT SHORT TERM GOAL #2   Title Pt will demo active elbow flexion to 90 deg against gravity, 2+/5 periscapular MMT by 4/20   Time 4   Period Weeks   Status Achieved     PT SHORT TERM GOAL #3   Title Pt able to perform GHJ ROM to shoulder height actively, against gravity by 5/4   Time 6   Period Weeks   Status On-going     PT SHORT TERM GOAL #4   Title Full elbow extension actively by 5/4   Time 6   Period Weeks   Status On-going     PT SHORT TERM GOAL #5   Title Pt will verbalize increased use of RUE for self care activities by 4/20   Time 4   Period Weeks   Status Partially Met  PT Long Term Goals - 09/19/16 1003      PT LONG TERM GOAL #1   Title Pt will be able to use RUE to draw at and below shoulder height without hiking shoulder by 6/15 in order to begin return to school   Baseline is using arm but is still limited in ability move laterally   Time 8   Period Weeks   Status On-going     PT LONG TERM GOAL #2   Title Pt will demo GHJ AROM against gravity within 15 deg of LUE   Baseline see flowsheet   Time 8   Period Weeks   Status On-going     PT LONG TERM GOAL #3   Title Pt will be able to carry & manipulate a 3lb weight in his R hand to improve functional carrying & lifting capacity   Baseline limited to 1lb lifting due to instability at shoulder at this point   Status On-going     PT LONG TERM GOAL #4   Title FOTO to 71% ability to indicate significant improvement in functional ability   Baseline 56% limited on 6/15   Time 8   Period Weeks   Status On-going               Plan - 10/10/16 1058    Clinical Impression Statement pt demo  improved control of shoulder in external rotation with minimal collapse into full IR.    PT Treatment/Interventions ADLs/Self Care Home Management;Cryotherapy;Functional mobility training;Therapeutic activities;Therapeutic exercise;Neuromuscular re-education;Patient/family education;Passive range of motion;Scar mobilization;Manual lymph drainage;Manual techniques;Taping;Dry needling   PT Next Visit Plan GHJ stability   PT Home Exercise Plan scar mobility, playdough hand work, scapular retraction, passive supination stretch-pull hand fwd to stretch elbow-active GHJ ext to pull back; table slide (move GHJ); Iso ER hold with L arm pulling yellow band, supine on couch AROM ER and biceps curl   Consulted and Agree with Plan of Care Patient      Patient will benefit from skilled therapeutic intervention in order to improve the following deficits and impairments:  Decreased range of motion, Increased fascial restricitons, Impaired UE functional use, Increased muscle spasms, Decreased endurance, Decreased activity tolerance, Pain, Improper body mechanics, Impaired flexibility, Hypomobility, Decreased scar mobility, Decreased mobility, Decreased strength, Postural dysfunction  Visit Diagnosis: Right shoulder pain, unspecified chronicity  Pain in right elbow  Stiffness of right elbow, not elsewhere classified  Muscle weakness (generalized)  Stiffness of right shoulder, not elsewhere classified     Problem List Patient Active Problem List   Diagnosis Date Noted  . 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 Leukemia, lymphocytic, acute ([place]) 12/21/2012    Tradarius Reinwald C. Shirel Mallis PT, DPT 10/10/16 11:01 AM   [facility] [address] [place], [place], [address] Phone: [phone]   Fax:  [phone]  Name: Laquan Beier MRN: [id number] Date of Birth: [birth year]

## 2016-10-17 ENCOUNTER — Ambulatory Visit: Payer: BC Managed Care – PPO | Admitting: Physical Therapy

## 2016-10-17 DIAGNOSIS — M25621 Stiffness of right elbow, not elsewhere classified: Secondary | ICD-10-CM

## 2016-10-17 DIAGNOSIS — M6281 Muscle weakness (generalized): Secondary | ICD-10-CM

## 2016-10-17 DIAGNOSIS — M25521 Pain in right elbow: Secondary | ICD-10-CM

## 2016-10-17 DIAGNOSIS — M25511 Pain in right shoulder: Secondary | ICD-10-CM

## 2016-10-17 DIAGNOSIS — M25611 Stiffness of right shoulder, not elsewhere classified: Secondary | ICD-10-CM

## 2016-10-17 NOTE — Therapy (Signed)
[place], [place], [address] Phone: [phone]   Fax:  [phone]  Physical Therapy Treatment  Patient Details  Name: Maurice Wright MRN: [id number] Date of Birth: Feb 13, 1996 Referring Provider: Chong Sicilian, MD  Encounter Date: 10/17/2016      PT End of Session - 10/17/16 1118    Visit Number 27   Number of Visits 30   Date for PT Re-Evaluation 11/16/16   Authorization Type BCBS- no visit limit   PT Start Time 1015   PT Stop Time 1055   PT Time Calculation (min) 40 min      Past Medical History:  Diagnosis Date  . Cancer ([place])     No past surgical history on file.  There were no vitals filed for this visit.      Subjective Assessment - 10/17/16 1118    Subjective A little sore after PT, no pain    Currently in Pain? No/denies                         [facility] Adult PT Treatment/Exercise - 10/17/16 0001      Shoulder Exercises: Seated   Other Seated Exercises ER rolling velcro handle into ER/IR , triceps press down with yellow band x 25      Shoulder Exercises: Prone   Other Prone Exercises row 2 x going to fatigue 1#, row with tricep kickback 2 x going to fatigue 2 x 8    Other Prone Exercises IYT 2 x 12 each     Shoulder Exercises: Standing   Other Standing Exercises UE ranger on 6 inch step flexion cues for scapula position , horizontal abduction/adduction (painful so disc) , ER/IR      Shoulder Exercises: ROM/Strengthening   UBE (Upper Arm Bike) 2 min fwd, 2 min back L1   Other ROM/Strengthening Exercises wall ladder 5 x with eccentric lowering                  PT Short Term Goals - 10/06/16 1010      PT SHORT TERM GOAL #1   Title GHJ PROM within 20 deg of L shoulder by 4/20   Time 4   Period Weeks   Status On-going     PT SHORT TERM GOAL #2   Title Pt will demo active elbow flexion to 90 deg against gravity, 2+/5 periscapular MMT by 4/20    Time 4   Period Weeks   Status Achieved     PT SHORT TERM GOAL #3   Title Pt able to perform GHJ ROM to shoulder height actively, against gravity by 5/4   Time 6   Period Weeks   Status On-going     PT SHORT TERM GOAL #4   Title Full elbow extension actively by 5/4   Time 6   Period Weeks   Status On-going     PT SHORT TERM GOAL #5   Title Pt will verbalize increased use of RUE for self care activities by 4/20   Time 4   Period Weeks   Status Partially Met           PT Long Term Goals - 09/19/16 1003      PT LONG TERM GOAL #1   Title Pt will be able to use RUE to draw at and below shoulder height without hiking shoulder by 6/15 in order to begin return to school   Baseline is  using arm but is still limited in ability move laterally   Time 8   Period Weeks   Status On-going     PT LONG TERM GOAL #2   Title Pt will demo GHJ AROM against gravity within 15 deg of LUE   Baseline see flowsheet   Time 8   Period Weeks   Status On-going     PT LONG TERM GOAL #3   Title Pt will be able to carry & manipulate a 3lb weight in his R hand to improve functional carrying & lifting capacity   Baseline limited to 1lb lifting due to instability at shoulder at this point   Status On-going     PT LONG TERM GOAL #4   Title FOTO to 71% ability to indicate significant improvement in functional ability   Baseline 56% limited on 6/15   Time 8   Period Weeks   Status On-going               Plan - 10/17/16 1116    Clinical Impression Statement Some pain UE ranger ER/IR horizontal abduction and adduction. Worked in prone for scapular/GHJ  stabilizers.    PT Next Visit Plan GHJ stability   PT Home Exercise Plan scar mobility, playdough hand work, scapular retraction, passive supination stretch-pull hand fwd to stretch elbow-active GHJ ext to pull back; table slide (move GHJ); Iso ER hold with L arm pulling yellow band, supine on couch AROM ER and biceps curl   Consulted and  Agree with Plan of Care Patient   Family Member Consulted Dad      Patient will benefit from skilled therapeutic intervention in order to improve the following deficits and impairments:  Decreased range of motion, Increased fascial restricitons, Impaired UE functional use, Increased muscle spasms, Decreased endurance, Decreased activity tolerance, Pain, Improper body mechanics, Impaired flexibility, Hypomobility, Decreased scar mobility, Decreased mobility, Decreased strength, Postural dysfunction  Visit Diagnosis: Right shoulder pain, unspecified chronicity  Pain in right elbow  Stiffness of right elbow, not elsewhere classified  Muscle weakness (generalized)  Stiffness of right shoulder, not elsewhere classified     Problem List Patient Active Problem List   Diagnosis Date Noted  . 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 Leukemia, lymphocytic, acute ([place]) 12/21/2012    Dorene Ar, PTA 10/17/2016, 11:19 AM  [facility] [address] [place], [place], [address] Phone: [phone]   Fax:  [phone]  Name: Maurice Wright MRN: [id number] Date of Birth: 06/29/1995

## 2016-10-22 ENCOUNTER — Ambulatory Visit: Payer: BC Managed Care – PPO | Admitting: Physical Therapy

## 2016-10-22 DIAGNOSIS — M25511 Pain in right shoulder: Secondary | ICD-10-CM

## 2016-10-22 DIAGNOSIS — M6281 Muscle weakness (generalized): Secondary | ICD-10-CM

## 2016-10-22 DIAGNOSIS — M25611 Stiffness of right shoulder, not elsewhere classified: Secondary | ICD-10-CM

## 2016-10-22 DIAGNOSIS — M25521 Pain in right elbow: Secondary | ICD-10-CM

## 2016-10-22 DIAGNOSIS — M25621 Stiffness of right elbow, not elsewhere classified: Secondary | ICD-10-CM

## 2016-10-23 NOTE — Therapy (Signed)
[place], [place], [address] Phone: [phone]   Fax:  [phone]  Physical Therapy Treatment  Patient Details  Name: Maurice Wright MRN: [id number] Date of Birth: 03/05/1996 Referring Provider: Chong Sicilian, MD  Encounter Date: 10/22/2016      PT End of Session - 10/22/16 0750    Visit Number 28   Number of Visits 30   Date for PT Re-Evaluation 11/16/16   Authorization Type BCBS- no visit limit   PT Start Time 1015   PT Stop Time 1100   PT Time Calculation (min) 45 min      Past Medical History:  Diagnosis Date  . Cancer ([place])     No past surgical history on file.  There were no vitals filed for this visit.      Subjective Assessment - 10/23/16 0754    Currently in Pain? No/denies                         [facility] Adult PT Treatment/Exercise - 10/22/16 0001      Shoulder Exercises: Prone   Other Prone Exercises row 2 x going to fatigue 1#, row with tricep kickback 2 x going to fatigue 2 x 8    Other Prone Exercises IYT 2 x 12 each, ball on floor shoulder flexion circles x 10 each way , push into ball x 10      Shoulder Exercises: Standing   Retraction 10 reps   Other Standing Exercises rhythmis stab at neutral ffor IR/ER, manual resistance to all planes 10 reps each    Other Standing Exercises UE ranger on floor step flexion cues for scapula position , horizontal abduction/adduction (painful so disc) , ER/IR      Shoulder Exercises: Pulleys   Flexion 2 minutes     Shoulder Exercises: ROM/Strengthening   UBE (Upper Arm Bike) 2 min fwd, 2 min back L1     Shoulder Exercises: Isometric Strengthening   Flexion Limitations 20 sec x 5 into towel/wall    Extension Limitations 20 sec x 5 into towel/wall    External Rotation Limitations 20 sec x 5 into opposite hand   Internal Rotation --   Internal Rotation Limitations 20 sec x 5 into towel/wall                 PT Education - 10/23/16 0753    Education provided Yes   Education Details HEP   Person(s) Educated Patient   Methods Explanation   Comprehension Verbalized understanding          PT Short Term Goals - 10/06/16 1010      PT SHORT TERM GOAL #1   Title GHJ PROM within 20 deg of L shoulder by 4/20   Time 4   Period Weeks   Status On-going     PT SHORT TERM GOAL #2   Title Pt will demo active elbow flexion to 90 deg against gravity, 2+/5 periscapular MMT by 4/20   Time 4   Period Weeks   Status Achieved     PT SHORT TERM GOAL #3   Title Pt able to perform GHJ ROM to shoulder height actively, against gravity by 5/4   Time 6   Period Weeks   Status On-going     PT SHORT TERM GOAL #4   Title Full elbow extension actively by 5/4   Time 6   Period Weeks   Status On-going  PT SHORT TERM GOAL #5   Title Pt will verbalize increased use of RUE for self care activities by 4/20   Time 4   Period Weeks   Status Partially Met           PT Long Term Goals - 09/19/16 1003      PT LONG TERM GOAL #1   Title Pt will be able to use RUE to draw at and below shoulder height without hiking shoulder by 6/15 in order to begin return to school   Baseline is using arm but is still limited in ability move laterally   Time 8   Period Weeks   Status On-going     PT LONG TERM GOAL #2   Title Pt will demo GHJ AROM against gravity within 15 deg of LUE   Baseline see flowsheet   Time 8   Period Weeks   Status On-going     PT LONG TERM GOAL #3   Title Pt will be able to carry & manipulate a 3lb weight in his R hand to improve functional carrying & lifting capacity   Baseline limited to 1lb lifting due to instability at shoulder at this point   Status On-going     PT LONG TERM GOAL #4   Title FOTO to 71% ability to indicate significant improvement in functional ability   Baseline 56% limited on 6/15   Time 8   Period Weeks   Status On-going               Plan -  10/22/16 0750    Clinical Impression Statement Instructed pt in Isometrics for HEP. He is able to hold his arm in neutral, flexed at his side. Continued GHJ stability as tolerated. No increased pain.    PT Next Visit Plan GHJ stability   PT Home Exercise Plan scar mobility, playdough hand work, scapular retraction, passive supination stretch-pull hand fwd to stretch elbow-active GHJ ext to pull back; table slide (move GHJ); Iso ER hold with L arm pulling yellow band, supine on couch AROM ER and biceps curl, isometrics all planes    Consulted and Agree with Plan of Care Patient      Patient will benefit from skilled therapeutic intervention in order to improve the following deficits and impairments:  Decreased range of motion, Increased fascial restricitons, Impaired UE functional use, Increased muscle spasms, Decreased endurance, Decreased activity tolerance, Pain, Improper body mechanics, Impaired flexibility, Hypomobility, Decreased scar mobility, Decreased mobility, Decreased strength, Postural dysfunction  Visit Diagnosis: Right shoulder pain, unspecified chronicity  Pain in right elbow  Stiffness of right elbow, not elsewhere classified  Muscle weakness (generalized)  Stiffness of right shoulder, not elsewhere classified     Problem List Patient Active Problem List   Diagnosis Date Noted  . 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 Leukemia, lymphocytic, acute ([place]) 12/21/2012    Dorene Ar, PTA 10/23/2016, 7:54 AM  [place], [place], [address] Phone: [phone]   Fax:  [phone]  Name: Maurice Wright MRN: [id number] Date of Birth: 1995-07-23

## 2016-10-24 ENCOUNTER — Ambulatory Visit: Payer: BC Managed Care – PPO | Admitting: Physical Therapy

## 2016-10-24 DIAGNOSIS — M25511 Pain in right shoulder: Secondary | ICD-10-CM | POA: Diagnosis not present

## 2016-10-24 DIAGNOSIS — M25521 Pain in right elbow: Secondary | ICD-10-CM

## 2016-10-24 DIAGNOSIS — M25621 Stiffness of right elbow, not elsewhere classified: Secondary | ICD-10-CM

## 2016-10-24 DIAGNOSIS — M6281 Muscle weakness (generalized): Secondary | ICD-10-CM

## 2016-10-24 DIAGNOSIS — M25611 Stiffness of right shoulder, not elsewhere classified: Secondary | ICD-10-CM

## 2016-10-24 NOTE — Therapy (Signed)
[place], [place], [address] Phone: [phone]   Fax:  [phone]  Physical Therapy Treatment  Patient Details  Name: Maurice Wright MRN: [id number] Date of Birth: [birth year] Referring Provider: Chong Sicilian, MD  Encounter Date: 10/24/2016      PT End of Session - 10/24/16 1112    Visit Number 29   Number of Visits 30   Date for PT Re-Evaluation 11/16/16   Authorization Type BCBS- no visit limit   PT Start Time 1101   PT Stop Time 1144   PT Time Calculation (min) 43 min      Past Medical History:  Diagnosis Date  . Cancer ([place])     No past surgical history on file.  There were no vitals filed for this visit.      Subjective Assessment - 10/24/16 1111    Subjective Elbow got a little stiff after walking around [employer] for several hours yesterday.    Currently in Pain? No/denies            [facility] PT Assessment - 10/24/16 0001      PROM   Right Shoulder Flexion 110 Degrees   Right Shoulder ABduction 95 Degrees   Right Shoulder Internal Rotation 90 Degrees   Right Shoulder External Rotation 90 Degrees                     [facility] Adult PT Treatment/Exercise - 10/24/16 0001      Shoulder Exercises: Supine   Other Supine Exercises eccentric lowering into IR and ER from neutral,  multiple bouts each way with increased contol , rhythmic stab      Shoulder Exercises: Prone   Other Prone Exercises row 3# x 10, horizontal abduction AROM x 10    Other Prone Exercises IYT 2 x 12 each, ball on floor shoulder flexion circles x 10 each way , push into ball x 10      Shoulder Exercises: Standing   Other Standing Exercises UE ranger on floor step flexion cues for scapula position , horizontal abduction/adduction (painful so disc) , ER/IR      Shoulder Exercises: Pulleys   Flexion 2 minutes     Shoulder Exercises: ROM/Strengthening   UBE (Upper Arm Bike) 2 min fwd, 2 min back  L1     Shoulder Exercises: Isometric Strengthening   Flexion Limitations 20 sec x 5 into towel/wall    Extension Limitations 20 sec x 5 into towel/wall    External Rotation Limitations 20 sec x 5 into opposite hand   Internal Rotation Limitations 20 sec x 5 into towel/wall     Manual Therapy   Manual therapy comments contract relx elbow flexion and extension                 PT Education - 10/23/16 0753    Education provided Yes   Education Details HEP   Person(s) Educated Patient   Methods Explanation   Comprehension Verbalized understanding          PT Short Term Goals - 10/06/16 1010      PT SHORT TERM GOAL #1   Title GHJ PROM within 20 deg of L shoulder by 4/20   Time 4   Period Weeks   Status On-going     PT SHORT TERM GOAL #2   Title Pt will demo active elbow flexion to 90 deg against gravity, 2+/5 periscapular MMT by 4/20   Time 4  Period Weeks   Status Achieved     PT SHORT TERM GOAL #3   Title Pt able to perform GHJ ROM to shoulder height actively, against gravity by 5/4   Time 6   Period Weeks   Status On-going     PT SHORT TERM GOAL #4   Title Full elbow extension actively by 5/4   Time 6   Period Weeks   Status On-going     PT SHORT TERM GOAL #5   Title Pt will verbalize increased use of RUE for self care activities by 4/20   Time 4   Period Weeks   Status Partially Met           PT Long Term Goals - 09/19/16 1003      PT LONG TERM GOAL #1   Title Pt will be able to use RUE to draw at and below shoulder height without hiking shoulder by 6/15 in order to begin return to school   Baseline is using arm but is still limited in ability move laterally   Time 8   Period Weeks   Status On-going     PT LONG TERM GOAL #2   Title Pt will demo GHJ AROM against gravity within 15 deg of LUE   Baseline see flowsheet   Time 8   Period Weeks   Status On-going     PT LONG TERM GOAL #3   Title Pt will be able to carry & manipulate a 3lb  weight in his R hand to improve functional carrying & lifting capacity   Baseline limited to 1lb lifting due to instability at shoulder at this point   Status On-going     PT LONG TERM GOAL #4   Title FOTO to 71% ability to indicate significant improvement in functional ability   Baseline 56% limited on 6/15   Time 8   Period Weeks   Status On-going               Plan - 10/24/16 1148    Clinical Impression Statement Pt demonstrates increased control with eccentric IR and ER. His PROM of shoulder flexion and abduction has increased. Progressing toward ROM goals.    PT Next Visit Plan GHJ stability   PT Home Exercise Plan scar mobility, playdough hand work, scapular retraction, passive supination stretch-pull hand fwd to stretch elbow-active GHJ ext to pull back; table slide (move GHJ); Iso ER hold with L arm pulling yellow band, supine on couch AROM ER and biceps curl, isometrics all planes    Consulted and Agree with Plan of Care Patient      Patient will benefit from skilled therapeutic intervention in order to improve the following deficits and impairments:  Decreased range of motion, Increased fascial restricitons, Impaired UE functional use, Increased muscle spasms, Decreased endurance, Decreased activity tolerance, Pain, Improper body mechanics, Impaired flexibility, Hypomobility, Decreased scar mobility, Decreased mobility, Decreased strength, Postural dysfunction  Visit Diagnosis: Right shoulder pain, unspecified chronicity  Pain in right elbow  Stiffness of right elbow, not elsewhere classified  Muscle weakness (generalized)  Stiffness of right shoulder, not elsewhere classified     Problem List Patient Active Problem List   Diagnosis Date Noted  . 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 Leukemia, lymphocytic, acute ([place]) 12/21/2012    Dorene Ar, PTA 10/24/2016, 11:52 AM  [facility] [address] [place], [place], [address] Phone: [phone]   Fax:  [phone]  Name: Maurice Wright MRN: [id number] Date of Birth: 11-11-1995

## 2016-11-03 ENCOUNTER — Ambulatory Visit: Payer: BC Managed Care – PPO | Admitting: Physical Therapy

## 2016-11-03 DIAGNOSIS — M25611 Stiffness of right shoulder, not elsewhere classified: Secondary | ICD-10-CM

## 2016-11-03 DIAGNOSIS — M25511 Pain in right shoulder: Secondary | ICD-10-CM

## 2016-11-03 DIAGNOSIS — M25521 Pain in right elbow: Secondary | ICD-10-CM

## 2016-11-03 DIAGNOSIS — M6281 Muscle weakness (generalized): Secondary | ICD-10-CM

## 2016-11-03 DIAGNOSIS — M25621 Stiffness of right elbow, not elsewhere classified: Secondary | ICD-10-CM

## 2016-11-03 NOTE — Therapy (Signed)
[place], [place], [address] Phone: [phone]   Fax:  [phone]  Physical Therapy Treatment  Patient Details  Name: Maurice Wright MRN: [id number] Date of Birth: 1995-11-02 Referring Provider: Chong Sicilian, MD  Encounter Date: 11/03/2016      PT End of Session - 11/03/16 1013    Visit Number 30   Number of Visits 30   Date for PT Re-Evaluation 11/16/16   Authorization Type [employer]- no visit limit   PT Start Time 1010   PT Stop Time 1055   PT Time Calculation (min) 45 min      Past Medical History:  Diagnosis Date  . Cancer ([place])     No past surgical history on file.  There were no vitals filed for this visit.      Subjective Assessment - 11/03/16 1016    Subjective No pain. Had deep tissue massage from PT while admitted last week. I dont have to be admitted anymore.    Currently in Pain? No/denies            [facility] PT Assessment - 11/03/16 0001      AROM   Right Shoulder Extension 35 Degrees   Right Shoulder Flexion 22 Degrees   Right Shoulder ABduction 38 Degrees   Right Shoulder Internal Rotation 45 Degrees  standing can pull to stomach   Right Shoulder External Rotation 15 Degrees  standing with elbow flexed      Strength   Right Hand Grip (lbs) 55lb                     [facility] Adult PT Treatment/Exercise - 11/03/16 0001      Shoulder Exercises: Supine   Other Supine Exercises eccentric lowering into IR and ER from neutral,  multiple bouts each way with increased contol , rhythmic stab      Shoulder Exercises: Prone   Other Prone Exercises row 3# x 10, horizontal abduction AROM x 10    Other Prone Exercises IYT 2 x 12 each, ball on floor shoulder flexion circles x 10 each way , push into ball x 10      Shoulder Exercises: Standing   Other Standing Exercises bicep curls 2#   Other Standing Exercises UE ranger on floor step flexion cues for scapula  position , horizontal abduction/adduction (painful so disc) , ER/IR      Shoulder Exercises: Pulleys   Flexion 2 minutes     Shoulder Exercises: ROM/Strengthening   UBE (Upper Arm Bike) 2 min fwd, 2 min back L1     Shoulder Exercises: Isometric Strengthening   Flexion Limitations 20 sec x 5 into towel/wall    Extension Limitations 20 sec x 5 into towel/wall    External Rotation Limitations 20 sec x 5 into opposite hand   Internal Rotation Limitations 20 sec x 5 into towel/wall     Manual Therapy   Manual therapy comments contract relx elbow flexion and extension                   PT Short Term Goals - 11/03/16 1304      PT SHORT TERM GOAL #1   Title GHJ PROM within 20 deg of L shoulder by 4/20   Time 4   Period Weeks   Status On-going     PT SHORT TERM GOAL #2   Title Pt will demo active elbow flexion to 90 deg against gravity, 2+/5 periscapular  MMT by 4/20   Baseline able to demo active elbow flexion, MMT to be tested   Time 4   Period Weeks   Status Achieved     PT SHORT TERM GOAL #3   Title Pt able to perform GHJ ROM to shoulder height actively, against gravity by 5/4   Baseline unable at thistime   Time 6   Period Weeks   Status On-going     PT SHORT TERM GOAL #4   Title Full elbow extension actively by 5/4   Baseline -26   Time 6   Period Weeks   Status On-going     PT SHORT TERM GOAL #5   Title Pt will verbalize increased use of RUE for self care activities by 4/20   Baseline unable to eat but is able to use for dressing and some drawing   Time 4   Period Weeks   Status Partially Met           PT Long Term Goals - 09/19/16 1003      PT LONG TERM GOAL #1   Title Pt will be able to use RUE to draw at and below shoulder height without hiking shoulder by 6/15 in order to begin return to school   Baseline is using arm but is still limited in ability move laterally   Time 8   Period Weeks   Status On-going     PT LONG TERM GOAL #2    Title Pt will demo GHJ AROM against gravity within 15 deg of LUE   Baseline see flowsheet   Time 8   Period Weeks   Status On-going     PT LONG TERM GOAL #3   Title Pt will be able to carry & manipulate a 3lb weight in his R hand to improve functional carrying & lifting capacity   Baseline limited to 1lb lifting due to instability at shoulder at this point   Status On-going     PT LONG TERM GOAL #4   Title FOTO to 71% ability to indicate significant improvement in functional ability   Baseline 56% limited on 6/15   Time 8   Period Weeks   Status On-going               Plan - 11/03/16 1058    Clinical Impression Statement Grip strength improved. Limited with flexion, other active motions improved slightly. No change in functional.    PT Next Visit Plan GHJ stability   PT Home Exercise Plan scar mobility, playdough hand work, scapular retraction, passive supination stretch-pull hand fwd to stretch elbow-active GHJ ext to pull back; table slide (move GHJ); Iso ER hold with L arm pulling yellow band, supine on couch AROM ER and biceps curl, isometrics all planes    Consulted and Agree with Plan of Care Patient      Patient will benefit from skilled therapeutic intervention in order to improve the following deficits and impairments:  Decreased range of motion, Increased fascial restricitons, Impaired UE functional use, Increased muscle spasms, Decreased endurance, Decreased activity tolerance, Pain, Improper body mechanics, Impaired flexibility, Hypomobility, Decreased scar mobility, Decreased mobility, Decreased strength, Postural dysfunction  Visit Diagnosis: Right shoulder pain, unspecified chronicity  Pain in right elbow  Stiffness of right elbow, not elsewhere classified  Muscle weakness (generalized)  Stiffness of right shoulder, not elsewhere classified     Problem List Patient Active Problem List   Diagnosis Date Noted  . 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  Leukemia, lymphocytic, acute ([place]) 12/21/2012    Dorene Ar, PTA 11/03/2016, 1:06 PM  [facility] [address] [place], [place], [address] Phone: [phone]   Fax:  [phone]  Name: Maurice Wright MRN: [id number] Date of Birth: 01/18/1996

## 2016-11-05 ENCOUNTER — Ambulatory Visit: Payer: BC Managed Care – PPO | Attending: Orthopedic Surgery | Admitting: Physical Therapy

## 2016-11-05 ENCOUNTER — Encounter: Payer: Self-pay | Admitting: Physical Therapy

## 2016-11-05 DIAGNOSIS — M25511 Pain in right shoulder: Secondary | ICD-10-CM | POA: Insufficient documentation

## 2016-11-05 DIAGNOSIS — M25521 Pain in right elbow: Secondary | ICD-10-CM | POA: Insufficient documentation

## 2016-11-05 DIAGNOSIS — M25621 Stiffness of right elbow, not elsewhere classified: Secondary | ICD-10-CM | POA: Insufficient documentation

## 2016-11-05 DIAGNOSIS — M6281 Muscle weakness (generalized): Secondary | ICD-10-CM | POA: Diagnosis present

## 2016-11-05 DIAGNOSIS — M25611 Stiffness of right shoulder, not elsewhere classified: Secondary | ICD-10-CM | POA: Insufficient documentation

## 2016-11-05 NOTE — Therapy (Signed)
[place], [place], [address] Phone: [phone]   Fax:  [phone]  Physical Therapy Treatment / Re-certification  Patient Details  Name: Maurice Wright MRN: [id number] Date of Birth: 1995-05-18 Referring Provider: Chong Sicilian, MD  Encounter Date: 11/05/2016      PT End of Session - 11/05/16 1127    Visit Number 31   Number of Visits 39   Date for PT Re-Evaluation 12/31/16   PT Start Time 1018   PT Stop Time 1101   PT Time Calculation (min) 43 min   Activity Tolerance Patient tolerated treatment well   Behavior During Therapy [facility] for tasks assessed/performed      Past Medical History:  Diagnosis Date  . Cancer [facility])     History reviewed. No pertinent surgical history.  There were no vitals filed for this visit.      Subjective Assessment - 11/05/16 1023    Subjective " no issues aside from my muscles just being tight"    Currently in Pain? No/denies            [facility] PT Assessment - 11/05/16 1034      Observation/Other Assessments   Focus on Therapeutic Outcomes (FOTO)  58% limited     AROM   Right Shoulder Extension 35 Degrees   Right Shoulder Flexion 22 Degrees   Right Shoulder ABduction 38 Degrees   Right Shoulder Internal Rotation 45 Degrees   Right Shoulder External Rotation 15 Degrees     Strength   Right/Left Shoulder Right   Right Shoulder Flexion 3/5  within available ROM   Right Shoulder Extension 3/5  within available ROM   Right Shoulder ABduction 3-/5  within available ROM   Right Shoulder Internal Rotation 2+/5  within available ROM   Right Shoulder External Rotation 2+/5  within available ROM   Right/Left Elbow Right   Right Elbow Flexion 3+/5  Within available ROM   Right Elbow Extension 3/5  within in available ROM   Right Hand Grip (lbs) 55lb                     [facility] Adult PT Treatment/Exercise - 11/05/16 1024      Elbow  Exercises   Other elbow exercises bicep curl  1 x 10 with 10 sec hold with controlled eccentric lowering   Other elbow exercises tricep extension 2 x going to fatigue with yellow band     Shoulder Exercises: Seated   Row Strengthening;Both;Theraband  going to fatigue   Theraband Level (Shoulder Row) Level 1 (Yellow)   Row Limitations cues to keep shoulder from hiking     Shoulder Exercises: ROM/Strengthening   UBE (Upper Arm Bike) L 2 x 6 min  changing direction at 3 min   Other ROM/Strengthening Exercises wand flexion 2 x 15  working into end range                PT Education - 11/05/16 1129    Education provided Yes   Education Details reviewed previously provided HEP and updated HEP to include shoulder and elbow strengthening increasing exercises to fatigue.   Person(s) Educated Patient   Methods Explanation;Verbal cues   Comprehension Verbalized understanding;Verbal cues required          PT Short Term Goals - 11/05/16 1135      PT SHORT TERM GOAL #1   Title GHJ PROM within 20 deg of L shoulder by 4/20  Period Weeks   Status On-going     PT SHORT TERM GOAL #2   Title Pt will demo active elbow flexion to 90 deg against gravity, 2+/5 periscapular MMT by 4/20   Time 4   Status Achieved     PT SHORT TERM GOAL #3   Title Pt able to perform GHJ ROM to shoulder height actively, against gravity by 5/4   Time 6   Period Weeks   Status On-going     PT SHORT TERM GOAL #4   Title Full elbow extension actively by 5/4   Time 6   Period Weeks   Status On-going     PT SHORT TERM GOAL #5   Title Pt will verbalize increased use of RUE for self care activities by 4/20   Time 4   Period Weeks   Status Partially Met           PT Long Term Goals - 11/05/16 1136      PT LONG TERM GOAL #1   Title Pt will be able to use RUE to draw at and below shoulder height without hiking shoulder by 6/15 in order to begin return to school   Time 8   Period Weeks   Status  On-going     PT LONG TERM GOAL #2   Title Pt will demo GHJ AROM against gravity within 15 deg of LUE   Time 8   Period Weeks   Status On-going     PT LONG TERM GOAL #3   Title Pt will be able to carry & manipulate a 3lb weight in his R hand to improve functional carrying & lifting capacity   Time 12   Period Weeks   Status On-going     PT LONG TERM GOAL #4   Title FOTO to 71% ability to indicate significant improvement in functional ability   Time 8   Status On-going               Plan - 11/05/16 1130    Clinical Impression Statement Pt continues to make gradual progress with strength and ROM of the elbow/ shoulder. He continues to report no pain just some soreness from the muscles from working. continued working on scapular stabilizers and GHJ  strengthening working into fatgiue to promote endurance and function. He is making gradual progress with goals and shoulder/elbow mobility. plan to continue with currentl POC to focus on strengthening, and promote function and independence with exercise.    Rehab Potential Good   PT Frequency 1x / week   PT Duration 8 weeks   PT Treatment/Interventions ADLs/Self Care Home Management;Cryotherapy;Functional mobility training;Therapeutic activities;Therapeutic exercise;Neuromuscular re-education;Patient/family education;Passive range of motion;Scar mobilization;Manual lymph drainage;Manual techniques;Taping;Dry needling   PT Next Visit Plan GHJ/scapular stability, elbow strengthening,    PT Home Exercise Plan scar mobility, playdough hand work, scapular retraction, passive supination stretch-pull hand fwd to stretch elbow-active GHJ ext to pull back; table slide (move GHJ); Iso ER hold with L arm pulling yellow band, supine on couch AROM ER and biceps curl, isometrics all planes    Consulted and Agree with Plan of Care Patient      Patient will benefit from skilled therapeutic intervention in order to improve the following deficits and  impairments:  Decreased range of motion, Increased fascial restricitons, Impaired UE functional use, Increased muscle spasms, Decreased endurance, Decreased activity tolerance, Pain, Improper body mechanics, Impaired flexibility, Hypomobility, Decreased scar mobility, Decreased mobility, Decreased strength, Postural dysfunction  Visit Diagnosis:  Right shoulder pain, unspecified chronicity - Plan: PT plan of care cert/re-cert  Pain in right elbow - Plan: PT plan of care cert/re-cert  Stiffness of right elbow, not elsewhere classified - Plan: PT plan of care cert/re-cert  Muscle weakness (generalized) - Plan: PT plan of care cert/re-cert  Stiffness of right shoulder, not elsewhere classified - Plan: PT plan of care cert/re-cert     Problem List Patient Active Problem List   Diagnosis Date Noted  . 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 Leukemia, lymphocytic, acute ([place]) 12/21/2012   Starr Lake PT, DPT, LAT, ATC  11/05/16  11:52 AM      [facility] [address] [place], [place], [address] Phone: [phone]   Fax:  [phone]  Name: Maurice Wright MRN: [id number] Date of Birth: [birth year]

## 2016-11-07 ENCOUNTER — Ambulatory Visit: Payer: BC Managed Care – PPO | Admitting: Physical Therapy

## 2016-11-07 ENCOUNTER — Encounter: Payer: Self-pay | Admitting: Physical Therapy

## 2016-11-07 DIAGNOSIS — M6281 Muscle weakness (generalized): Secondary | ICD-10-CM

## 2016-11-07 DIAGNOSIS — M25511 Pain in right shoulder: Secondary | ICD-10-CM

## 2016-11-07 DIAGNOSIS — M25621 Stiffness of right elbow, not elsewhere classified: Secondary | ICD-10-CM

## 2016-11-07 DIAGNOSIS — M25611 Stiffness of right shoulder, not elsewhere classified: Secondary | ICD-10-CM

## 2016-11-07 DIAGNOSIS — M25521 Pain in right elbow: Secondary | ICD-10-CM

## 2016-11-07 NOTE — Therapy (Signed)
[place], [place], [address] Phone: [phone]   Fax:  [phone]  Physical Therapy Treatment  Patient Details  Name: Maurice Wright MRN: [id number] Date of Birth: [birth year] Referring Provider: Chong Sicilian, MD  Encounter Date: 11/07/2016      PT End of Session - 11/07/16 1103    Visit Number 32   Number of Visits 39   Date for PT Re-Evaluation 12/31/16   PT Start Time 1016   PT Stop Time 1107   PT Time Calculation (min) 51 min   Activity Tolerance Patient tolerated treatment well   Behavior During Therapy [facility] for tasks assessed/performed      Past Medical History:  Diagnosis Date  . Cancer [facility])     History reviewed. No pertinent surgical history.  There were no vitals filed for this visit.      Subjective Assessment - 11/07/16 1017    Subjective "I have been sore, not in a bad way just from working out"   Currently in Pain? No/denies                         [facility] Adult PT Treatment/Exercise - 11/07/16 1020      Elbow Exercises   Other elbow exercises eccentric bicep curls 1 x 20   with yellow theraband   Other elbow exercises eccentric tricep extension 2 x 10  with yellow theraband     Shoulder Exercises: Supine   Protraction 5 reps;AAROM  with dowel rod, tactile cues for proper heigh   Protraction Limitations combine with shoulder flexion with dowel rod    Other Supine Exercises ER and IR touching to bolster 2 x 10  AAROM PRN for ER      Shoulder Exercises: Standing   Other Standing Exercises sustained bicep curl with reaching touching the ball  on counter   used cabinet to block shoulder prevent protraction      Shoulder Exercises: Pulleys   Flexion 2 minutes   Flexion Limitations --  scaption 2 xmin     Shoulder Exercises: ROM/Strengthening   UBE (Upper Arm Bike) L3 x 5 min   changing direction at 2:30 sec   Other ROM/Strengthening Exercises  wall ladder flexion with controlled eccentric lowering x 10     Cryotherapy   Number Minutes Cryotherapy 10 Minutes   Cryotherapy Location Shoulder   Type of Cryotherapy Ice pack  in prone                  PT Short Term Goals - 11/05/16 1135      PT SHORT TERM GOAL #1   Title GHJ PROM within 20 deg of L shoulder by 4/20   Period Weeks   Status On-going     PT SHORT TERM GOAL #2   Title Pt will demo active elbow flexion to 90 deg against gravity, 2+/5 periscapular MMT by 4/20   Time 4   Status Achieved     PT SHORT TERM GOAL #3   Title Pt able to perform GHJ ROM to shoulder height actively, against gravity by 5/4   Time 6   Period Weeks   Status On-going     PT SHORT TERM GOAL #4   Title Full elbow extension actively by 5/4   Time 6   Period Weeks   Status On-going     PT SHORT TERM GOAL #5   Title Pt will verbalize increased use  of RUE for self care activities by 4/20   Time 4   Period Weeks   Status Partially Met           PT Long Term Goals - 11/05/16 1136      PT LONG TERM GOAL #1   Title Pt will be able to use RUE to draw at and below shoulder height without hiking shoulder by 6/15 in order to begin return to school   Time 8   Period Weeks   Status On-going     PT LONG TERM GOAL #2   Title Pt will demo GHJ AROM against gravity within 15 deg of LUE   Time 8   Period Weeks   Status On-going     PT LONG TERM GOAL #3   Title Pt will be able to carry & manipulate a 3lb weight in his R hand to improve functional carrying & lifting capacity   Time 12   Period Weeks   Status On-going     PT LONG TERM GOAL #4   Title FOTO to 71% ability to indicate significant improvement in functional ability   Time 8   Status On-going               Plan - 11/07/16 1103    Clinical Impression Statement [name] reports only soreness from exericse but no pain. continued elbow and shoulder strengthening which he performs well but continues to  demonsrtate weakness with supine ER requiring Active assist. He reported soreness following todays session and request ice post session.    PT Next Visit Plan GHJ/scapular stability, elbow strengthening, address any questions or issues. bicep curl with reaching   PT Home Exercise Plan scar mobility, playdough hand work, scapular retraction, passive supination stretch-pull hand fwd to stretch elbow-active GHJ ext to pull back; table slide (move GHJ); Iso ER hold with L arm pulling yellow band, supine on couch AROM ER and biceps curl, isometrics all planes    Consulted and Agree with Plan of Care Patient      Patient will benefit from skilled therapeutic intervention in order to improve the following deficits and impairments:  Decreased range of motion, Increased fascial restricitons, Impaired UE functional use, Increased muscle spasms, Decreased endurance, Decreased activity tolerance, Pain, Improper body mechanics, Impaired flexibility, Hypomobility, Decreased scar mobility, Decreased mobility, Decreased strength, Postural dysfunction  Visit Diagnosis: Right shoulder pain, unspecified chronicity  Pain in right elbow  Stiffness of right elbow, not elsewhere classified  Muscle weakness (generalized)  Stiffness of right shoulder, not elsewhere classified     Problem List Patient Active Problem List   Diagnosis Date Noted  . 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 Leukemia, lymphocytic, acute ([place]) 12/21/2012    Starr Lake PT, DPT, LAT, ATC  11/07/16  11:10 AM      [facility] [address] [place], [place], [address] Phone: [phone]   Fax:  [phone]  Name: Maurice Wright MRN: [id number] Date of Birth: [birth year]

## 2016-11-14 ENCOUNTER — Encounter: Payer: Self-pay | Admitting: Physical Therapy

## 2016-11-14 ENCOUNTER — Ambulatory Visit: Payer: BC Managed Care – PPO | Admitting: Physical Therapy

## 2016-11-14 DIAGNOSIS — M6281 Muscle weakness (generalized): Secondary | ICD-10-CM

## 2016-11-14 DIAGNOSIS — M25621 Stiffness of right elbow, not elsewhere classified: Secondary | ICD-10-CM

## 2016-11-14 DIAGNOSIS — M25611 Stiffness of right shoulder, not elsewhere classified: Secondary | ICD-10-CM

## 2016-11-14 DIAGNOSIS — M25521 Pain in right elbow: Secondary | ICD-10-CM

## 2016-11-14 DIAGNOSIS — M25511 Pain in right shoulder: Secondary | ICD-10-CM | POA: Diagnosis not present

## 2016-11-14 NOTE — Therapy (Signed)
[place], [place], [address] Phone: [phone]   Fax:  [phone]  Physical Therapy Treatment  Patient Details  Name: Maurice Wright MRN: [id number] Date of Birth: 11-05-1995 Referring Provider: Chong Sicilian, MD  Encounter Date: 11/14/2016      PT End of Session - 11/14/16 0806    Visit Number 33   Number of Visits 39   Date for PT Re-Evaluation 12/31/16   Authorization Type BC[employer]- no visit limit   PT Start Time 0806  pt arrived late   PT Stop Time 0841   PT Time Calculation (min) 35 min   Activity Tolerance Patient tolerated treatment well   Behavior During Therapy [facility] for tasks assessed/performed      Past Medical History:  Diagnosis Date  . Cancer [facility])     History reviewed. No pertinent surgical history.  There were no vitals filed for this visit.      Subjective Assessment - 11/14/16 0806    Subjective Denies any soreness. Moving this weekend and begins school on Tues.    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 No/denies                         [facility] Adult PT Treatment/Exercise - 11/14/16 0001      Shoulder Exercises: Seated   Other Seated Exercises biceps curl yellow tband     Shoulder Exercises: Prone   Other Prone Exercises triceps extension- PT assist for scap stab     Shoulder Exercises: Sidelying   External Rotation 15 reps   External Rotation Limitations eccentric lower   Flexion 20 reps  ranger   Other Sidelying Exercises supination- arm at side     Shoulder Exercises: Standing   Flexion 10 reps   Flexion Limitations wall ladder 1#     Manual Therapy   Soft tissue mobilization IASTM  brachioradialis, proximal 1/2 of incision                PT Education - 11/14/16 0841    Education provided Yes   Education Details exercise form/rationale, sunscreen on incision, avoid bookbag wear on R shoulder, be  careful not to lift while moving.    Person(s) Educated Patient   Methods Explanation;Demonstration;Tactile cues;Verbal cues   Comprehension Verbalized understanding;Returned demonstration;Verbal cues required;Tactile cues required;Need further instruction          PT Short Term Goals - 11/05/16 1135      PT SHORT TERM GOAL #1   Title GHJ PROM within 20 deg of L shoulder by 4/20   Period Weeks   Status On-going     PT SHORT TERM GOAL #2   Title Pt will demo active elbow flexion to 90 deg against gravity, 2+/5 periscapular MMT by 4/20   Time 4   Status Achieved     PT SHORT TERM GOAL #3   Title Pt able to perform GHJ ROM to shoulder height actively, against gravity by 5/4   Time 6   Period Weeks   Status On-going     PT SHORT TERM GOAL #4   Title Full elbow extension actively by 5/4   Time 6   Period Weeks   Status On-going     PT SHORT TERM GOAL #5   Title Pt will verbalize increased use of RUE for self care activities by 4/20   Time 4   Period Weeks  Status Partially Met           PT Long Term Goals - 11/05/16 1136      PT LONG TERM GOAL #1   Title Pt will be able to use RUE to draw at and below shoulder height without hiking shoulder by 6/15 in order to begin return to school   Time 8   Period Weeks   Status On-going     PT LONG TERM GOAL #2   Title Pt will demo GHJ AROM against gravity within 15 deg of LUE   Time 8   Period Weeks   Status On-going     PT LONG TERM GOAL #3   Title Pt will be able to carry & manipulate a 3lb weight in his R hand to improve functional carrying & lifting capacity   Time 12   Period Weeks   Status On-going     PT LONG TERM GOAL #4   Title FOTO to 71% ability to indicate significant improvement in functional ability   Time 8   Status On-going               Plan - 11/14/16 4255    Clinical Impression Statement Mild soreness following IASTM, notable knotting in muscle indicating improved activation with  exercise. Pt able to control eccentric lower of arm from external rotation independently at approx 30 deg, boted ot lift some weight from PT assist from 90. Improved ability to correct scapular alignment with decreased cuing.    PT Treatment/Interventions ADLs/Self Care Home Management;Cryotherapy;Functional mobility training;Therapeutic activities;Therapeutic exercise;Neuromuscular re-education;Patient/family education;Passive range of motion;Scar mobilization;Manual lymph drainage;Manual techniques;Taping;Dry needling   PT Next Visit Plan GHJ/scapular stability, elbow strengthening, address any questions or issues. bicep curl with reaching   PT Home Exercise Plan scar mobility, playdough hand work, scapular retraction, passive supination stretch-pull hand fwd to stretch elbow-active GHJ ext to pull back; table slide (move GHJ); Iso ER hold with L arm pulling yellow band, supine on couch AROM ER and biceps curl, isometrics all planes    Consulted and Agree with Plan of Care Patient      Patient will benefit from skilled therapeutic intervention in order to improve the following deficits and impairments:  Decreased range of motion, Increased fascial restricitons, Impaired UE functional use, Increased muscle spasms, Decreased endurance, Decreased activity tolerance, Pain, Improper body mechanics, Impaired flexibility, Hypomobility, Decreased scar mobility, Decreased mobility, Decreased strength, Postural dysfunction  Visit Diagnosis: Right shoulder pain, unspecified chronicity  Pain in right elbow  Stiffness of right elbow, not elsewhere classified  Muscle weakness (generalized)  Stiffness of right shoulder, not elsewhere classified     Problem List Patient Active Problem List   Diagnosis Date Noted  . 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 Leukemia, lymphocytic, acute ([place]) 12/21/2012    Sally Reimers C. Esterlene Atiyeh PT, DPT 11/14/16 8:45 AM   [facility] [address] [place], [place], [address] Phone: [phone]   Fax:  [phone]  Name: Justine Cossin MRN: [id number] Date of Birth: 04-Apr-1996

## 2016-11-21 ENCOUNTER — Ambulatory Visit: Payer: BC Managed Care – PPO | Admitting: Physical Therapy

## 2016-11-21 ENCOUNTER — Encounter: Payer: Self-pay | Admitting: Physical Therapy

## 2016-11-21 DIAGNOSIS — M25511 Pain in right shoulder: Secondary | ICD-10-CM | POA: Diagnosis not present

## 2016-11-21 DIAGNOSIS — M6281 Muscle weakness (generalized): Secondary | ICD-10-CM

## 2016-11-21 DIAGNOSIS — M25611 Stiffness of right shoulder, not elsewhere classified: Secondary | ICD-10-CM

## 2016-11-21 DIAGNOSIS — M25521 Pain in right elbow: Secondary | ICD-10-CM

## 2016-11-21 DIAGNOSIS — M25621 Stiffness of right elbow, not elsewhere classified: Secondary | ICD-10-CM

## 2016-11-21 NOTE — Therapy (Signed)
[facility] [address] [place], [place], [address] Phone: [phone]   Fax:  [phone]  Physical Therapy Treatment  Patient Details  Name: Maurice Wright MRN: [id number] Date of Birth: 1995-09-28 Referring Provider: Kayleen Memos, MD  Encounter Date: 11/21/2016      PT End of Session - 11/21/16 0930    Visit Number 34   Number of Visits 39   Date for PT Re-Evaluation 12/31/16   PT Start Time 0846   PT Stop Time 0930   PT Time Calculation (min) 44 min   Activity Tolerance Patient tolerated treatment well   Behavior During Therapy [facility] for tasks assessed/performed      Past Medical History:  Diagnosis Date  . Cancer [facility])     History reviewed. No pertinent surgical history.  There were no vitals filed for this visit.      Subjective Assessment - 11/21/16 0849    Subjective "classes started and I am feeling fatigued because my energy levels are back up yet. Shoulder/ arm is still doing better"    Currently in Pain? No/denies                         [facility] Adult PT Treatment/Exercise - 11/21/16 0850      Shoulder Exercises: Seated   External Rotation AROM;Right  going to fatigue   External Rotation Limitations using towel to decrease abduction to promote ER,  tactile cues to prevent scapular retraction   Other Seated Exercises drawing with paper set to the R  to promote ER and towel tucked between the elbow and shoulder x 5 min  cues to use shoulder to promote muscle activation     Shoulder Exercises: Sidelying   External Rotation Limitations eccentric lower x 10, isometric holding 10 x 5 sec hold     Shoulder Exercises: Standing   Other Standing Exercises tricep press 2 x 15 with yellow theraband with scapular depression/ setting   tactile cues to for scapular setting     Shoulder Exercises: ROM/Strengthening   UBE (Upper Arm Bike) L3 x 8 min  changing direction at 4 min for endurance  training                PT Education - 11/21/16 0936    Education provided Yes   Education Details to practice drawing at home with the set up of the paper to the R and using towel between the elbow and his side to promtoe ER and place a towel unde rhand to promote easier slide and use shoulder for hand motion.   Person(s) Educated Patient   Methods Explanation;Verbal cues   Comprehension Verbalized understanding;Verbal cues required          PT Short Term Goals - 11/05/16 1135      PT SHORT TERM GOAL #1   Title GHJ PROM within 20 deg of L shoulder by 4/20   Period Weeks   Status On-going     PT SHORT TERM GOAL #2   Title Pt will demo active elbow flexion to 90 deg against gravity, 2+/5 periscapular MMT by 4/20   Time 4   Status Achieved     PT SHORT TERM GOAL #3   Title Pt able to perform GHJ ROM to shoulder height actively, against gravity by 5/4   Time 6   Period Weeks   Status On-going     PT SHORT TERM GOAL #4   Title  Full elbow extension actively by 5/4   Time 6   Period Weeks   Status On-going     PT SHORT TERM GOAL #5   Title Pt will verbalize increased use of RUE for self care activities by 4/20   Time 4   Period Weeks   Status Partially Met           PT Long Term Goals - 11/05/16 1136      PT LONG TERM GOAL #1   Title Pt will be able to use RUE to draw at and below shoulder height without hiking shoulder by 6/15 in order to begin return to school   Time 8   Period Weeks   Status On-going     PT LONG TERM GOAL #2   Title Pt will demo GHJ AROM against gravity within 15 deg of LUE   Time 8   Period Weeks   Status On-going     PT LONG TERM GOAL #3   Title Pt will be able to carry & manipulate a 3lb weight in his R hand to improve functional carrying & lifting capacity   Time 12   Period Weeks   Status On-going     PT LONG TERM GOAL #4   Title FOTO to 71% ability to indicate significant improvement in functional ability   Time 8    Status On-going               Plan - 11/21/16 0930    Clinical Impression Statement pt reports no pain. focused external rotation to promote control per pt request. progressed from sidelying to sitting working into endurance. pt was able to perform drawing activity with sheet offset to the R to promote ER using towel between body and elbow to promote motion. pt declined modalities post session   PT Next Visit Plan GHJ/scapular stability, elbow strengthening, address any questions or issues. bicep curl with reaching, address issues pt may have.    PT Home Exercise Plan scar mobility, playdough hand work, scapular retraction, passive supination stretch-pull hand fwd to stretch elbow-active GHJ ext to pull back; table slide (move GHJ); Iso ER hold with L arm pulling yellow band, supine on couch AROM ER and biceps curl, isometrics all planes    Consulted and Agree with Plan of Care Patient      Patient will benefit from skilled therapeutic intervention in order to improve the following deficits and impairments:  Decreased range of motion, Increased fascial restricitons, Impaired UE functional use, Increased muscle spasms, Decreased endurance, Decreased activity tolerance, Pain, Improper body mechanics, Impaired flexibility, Hypomobility, Decreased scar mobility, Decreased mobility, Decreased strength, Postural dysfunction  Visit Diagnosis: Right shoulder pain, unspecified chronicity  Pain in right elbow  Stiffness of right elbow, not elsewhere classified  Muscle weakness (generalized)  Stiffness of right shoulder, not elsewhere classified     Problem List Patient Active Problem List   Diagnosis Date Noted  . 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 Leukemia, lymphocytic, acute ([place]) 12/21/2012   Starr Lake PT, DPT, LAT, ATC  11/21/16  9:38 AM      [place] [facility] [address] [place], [place], [address] Phone: [phone]    Fax:  [phone]  Name: Maurice Wright MRN: [id number] Date of Birth: [birth year]

## 2016-11-28 ENCOUNTER — Ambulatory Visit: Payer: BC Managed Care – PPO | Admitting: Physical Therapy

## 2016-12-05 ENCOUNTER — Encounter: Payer: Self-pay | Admitting: Physical Therapy

## 2016-12-05 ENCOUNTER — Ambulatory Visit: Payer: BC Managed Care – PPO | Admitting: Physical Therapy

## 2016-12-05 DIAGNOSIS — M6281 Muscle weakness (generalized): Secondary | ICD-10-CM

## 2016-12-05 DIAGNOSIS — M25521 Pain in right elbow: Secondary | ICD-10-CM

## 2016-12-05 DIAGNOSIS — M25611 Stiffness of right shoulder, not elsewhere classified: Secondary | ICD-10-CM

## 2016-12-05 DIAGNOSIS — M25621 Stiffness of right elbow, not elsewhere classified: Secondary | ICD-10-CM

## 2016-12-05 DIAGNOSIS — M25511 Pain in right shoulder: Secondary | ICD-10-CM | POA: Diagnosis not present

## 2016-12-05 NOTE — Therapy (Signed)
[place], [place], [address] Phone: [phone]   Fax:  [phone]  Physical Therapy Treatment / Discharge summary  Patient Details  Name: Maurice Wright MRN: [id number] Date of Birth: 12-Jun-1995 Referring Provider: Chong Sicilian, MD  Encounter Date: 12/05/2016      PT End of Session - 12/05/16 5573    Visit Number 35   Number of Visits 39   Date for PT Re-Evaluation 12/31/16   PT Start Time 0845   PT Stop Time 0920  shortened session due to discharge   PT Time Calculation (min) 35 min   Activity Tolerance Patient tolerated treatment well   Behavior During Therapy [facility] for tasks assessed/performed      Past Medical History:  Diagnosis Date  . Cancer [facility])     History reviewed. No pertinent surgical history.  There were no vitals filed for this visit.      Subjective Assessment - 12/05/16 0845    Subjective "            [facility] PT Assessment - 12/05/16 0856      Observation/Other Assessments   Focus on Therapeutic Outcomes (FOTO)  58% limited     AROM   Right Shoulder Extension 40 Degrees   Right Shoulder Flexion 25 Degrees  utilizes protraction   Right Shoulder ABduction 35 Degrees   Right Shoulder Internal Rotation 70 Degrees  to stomach   Right Shoulder External Rotation 22 Degrees   Right Elbow Flexion 90   Right Elbow Extension -50     Strength   Right Shoulder Flexion 3/5   Right Shoulder Extension 3/5   Right Shoulder ABduction 3-/5   Right Shoulder Internal Rotation 3-/5   Right Shoulder External Rotation 3-/5   Right Elbow Flexion 4-/5   Right Elbow Extension 3/5                     [facility] Adult PT Treatment/Exercise - 12/05/16 0847      Shoulder Exercises: Seated   External Rotation Limitations using towel to decrease abduction to promote ER,  tactile cues to prevent scapular retraction   Other Seated Exercises drawing with paper set to the R   to promote ER and towel tucked between the elbow and shoulder x 5 min     Shoulder Exercises: ROM/Strengthening   UBE (Upper Arm Bike) L3 x 8 min  changing direction at 4 min   Other ROM/Strengthening Exercises table slides flexion , and abduction 2 x 10  cues to avoid going past 90 degrees abduction      Shoulder Exercises: Stretch   Other Shoulder Stretches upper trap stretch 2 x 30 sec hold                PT Education - 12/05/16 0924    Education provided Yes   Education Details reviewed previously provided HEP. benefits of continued exercise to maintain and promote mobility and strength, and importance of mobility, to take breaks as need to avoid getting tight or pain full throughout the day.   Person(s) Educated Patient   Methods Explanation;Verbal cues   Comprehension Verbalized understanding;Verbal cues required          PT Short Term Goals - 12/05/16 0906      PT SHORT TERM GOAL #1   Title GHJ PROM within 20 deg of L shoulder by 4/20   Time 4   Period Weeks   Status Partially Met  PT SHORT TERM GOAL #2   Title Pt will demo active elbow flexion to 90 deg against gravity, 2+/5 periscapular MMT by 4/20   Period Weeks   Status Achieved     PT SHORT TERM GOAL #3   Title Pt able to perform GHJ ROM to shoulder height actively, against gravity by 5/4   Period Weeks   Status Not Met     PT SHORT TERM GOAL #4   Title Full elbow extension actively by 5/4   Time 6   Period Weeks   Status Not Met     PT SHORT TERM GOAL #5   Title Pt will verbalize increased use of RUE for self care activities by 4/20   Time 4   Period Weeks   Status Partially Met           PT Long Term Goals - 12/05/16 0907      PT LONG TERM GOAL #1   Title Pt will be able to use RUE to draw at and below shoulder height without hiking shoulder by 6/15 in order to begin return to school   Time 8   Period Weeks   Status Achieved     PT LONG TERM GOAL #2   Title Pt will demo GHJ  AROM against gravity within 15 deg of LUE   Time 8   Period Weeks   Status Not Met     PT LONG TERM GOAL #3   Title Pt will be able to carry & manipulate a 3lb weight in his R hand to improve functional carrying & lifting capacity   Time 12   Period Weeks   Status Achieved     PT LONG TERM GOAL #4   Title FOTO to 71% ability to indicate significant improvement in functional ability   Time 8   Period Weeks   Status Not Met               Plan - 12/05/16 0926    Clinical Impression Statement pt has made great progress with physical therapy. He has improve shoulder mobility and elbow AROM compared at evaluation. continued limited AROM and strength to be expected following the surgery/ intervention he underwent.  He was able to do all exercises with no report of pain. He met or partially met all goals except for STG #3,#4, and LTG #2,#4. reviewed HEP and benefits of taking breaks and continued exercise. he reports he is able to maintain and progress his current level of function independenlty and will be discharged from PT today.    PT Next Visit Plan D/C   PT Home Exercise Plan scar mobility, playdough hand work, scapular retraction, passive supination stretch-pull hand fwd to stretch elbow-active GHJ ext to pull back; table slide (move GHJ); Iso ER hold with L arm pulling yellow band, supine on couch AROM ER and biceps curl, isometrics all planes    Consulted and Agree with Plan of Care Patient      Patient will benefit from skilled therapeutic intervention in order to improve the following deficits and impairments:  Decreased range of motion, Increased fascial restricitons, Impaired UE functional use, Increased muscle spasms, Decreased endurance, Decreased activity tolerance, Pain, Improper body mechanics, Impaired flexibility, Hypomobility, Decreased scar mobility, Decreased mobility, Decreased strength, Postural dysfunction  Visit Diagnosis: Right shoulder pain, unspecified  chronicity  Pain in right elbow  Stiffness of right elbow, not elsewhere classified  Muscle weakness (generalized)  Stiffness of right shoulder, not elsewhere classified  Problem List Patient Active Problem List   Diagnosis Date Noted  . 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 Leukemia, lymphocytic, acute ([place]) 12/21/2012   Starr Lake PT, DPT, LAT, ATC  12/05/16  9:31 AM      [place] [facility] [address] [place], [place], [address] Phone: [phone]   Fax:  [phone]  Name: Maurice Wright MRN: [id number] Date of Birth: 06-Dec-1995     PHYSICAL THERAPY DISCHARGE SUMMARY  Visits from Start of Care: 35  Current functional level related to goals / functional outcomes: See goals, FOTO 58% limited   Remaining deficits: Limited R elbow and shoulder AROM. Limited strength in the RUE. Intermittent tightness in the distal bicep relieved with soft tissue massage.    Education / Equipment: HEP, theraband, functional mobility, anatomy.  Plan: Patient agrees to discharge.  Patient goals were partially met. Patient is being discharged due to being pleased with the current functional level.  ?????    Chloe Flis PT, DPT, LAT, ATC  12/05/16  9:32 AM

## 2016-12-12 ENCOUNTER — Ambulatory Visit: Payer: BC Managed Care – PPO | Admitting: Physical Therapy

## 2019-07-01 ENCOUNTER — Ambulatory Visit: Payer: BC Managed Care – PPO | Attending: Internal Medicine

## 2019-07-01 DIAGNOSIS — Z23 Encounter for immunization: Secondary | ICD-10-CM

## 2019-07-01 NOTE — Progress Notes (Signed)
   Covid-19 Vaccination Clinic  Name:  MARQUAIL SCHLABACH    MRN: [id number] DOB: 1995/08/03  07/01/2019  Mr. Sloman was observed post Covid-19 immunization for 15 minutes without incident. He was provided with Vaccine Information Sheet and instruction to access the V-Safe system.   Mr. Blanda was instructed to call 911 with any severe reactions post vaccine: Marland Kitchen Difficulty breathing  . Swelling of face and throat  . A fast heartbeat  . A bad rash all over body  . Dizziness and weakness   Immunizations Administered    Name Date Dose VIS Date Route   Pfizer COVID-19 Vaccine 07/01/2019 10:50 AM 0.3 mL 03/18/2019 Intramuscular   Manufacturer: [place]   Lot: [id number]   [place]: [id number]

## 2019-07-25 ENCOUNTER — Ambulatory Visit: Payer: BC Managed Care – PPO | Attending: Internal Medicine

## 2019-07-25 DIAGNOSIS — Z23 Encounter for immunization: Secondary | ICD-10-CM

## 2019-07-25 NOTE — Progress Notes (Signed)
   Covid-19 Vaccination Clinic  Name:  Maurice Wright    MRN: [id number] DOB: 1995/07/03  07/25/2019  [name] was observed post Covid-19 immunization for 15 minutes without incident. He was provided with Vaccine Information Sheet and instruction to access the V-Safe system.   [name] was instructed to call 911 with any severe reactions post vaccine: Marland Kitchen Difficulty breathing  . Swelling of face and throat  . A fast heartbeat  . A bad rash all over body  . Dizziness and weakness   Immunizations Administered    Name Date Dose VIS Date Route   Pfizer COVID-19 Vaccine 07/25/2019  2:52 PM 0.3 mL 06/01/2018 Intramuscular   Manufacturer: [place]   Lot: [id number]   [place]: [id number]
